# Patient Record
Sex: Female | Born: 1980 | Race: White | Hispanic: No | Marital: Married | Smoking: Never smoker
Health system: Southern US, Community
[De-identification: ages and names within clinical notes are randomized; demographics above are authoritative.]

## PROBLEM LIST (undated history)

## (undated) ENCOUNTER — Inpatient Hospital Stay: Payer: Self-pay

## (undated) DIAGNOSIS — R51 Headache: Secondary | ICD-10-CM

## (undated) DIAGNOSIS — I10 Essential (primary) hypertension: Secondary | ICD-10-CM

## (undated) DIAGNOSIS — F419 Anxiety disorder, unspecified: Secondary | ICD-10-CM

## (undated) DIAGNOSIS — O24419 Gestational diabetes mellitus in pregnancy, unspecified control: Secondary | ICD-10-CM

## (undated) DIAGNOSIS — R519 Headache, unspecified: Secondary | ICD-10-CM

## (undated) DIAGNOSIS — J45909 Unspecified asthma, uncomplicated: Secondary | ICD-10-CM

## (undated) HISTORY — PX: NO PAST SURGERIES: SHX2092

## (undated) HISTORY — PX: WISDOM TOOTH EXTRACTION: SHX21

---

## 2007-04-17 ENCOUNTER — Ambulatory Visit: Payer: Self-pay | Admitting: Emergency Medicine

## 2007-04-17 ENCOUNTER — Emergency Department: Payer: Self-pay | Admitting: Emergency Medicine

## 2016-02-19 ENCOUNTER — Other Ambulatory Visit: Payer: Self-pay | Admitting: Obstetrics & Gynecology

## 2016-02-19 DIAGNOSIS — Z3689 Encounter for other specified antenatal screening: Secondary | ICD-10-CM

## 2016-02-23 ENCOUNTER — Ambulatory Visit
Admission: RE | Admit: 2016-02-23 | Discharge: 2016-02-23 | Disposition: A | Discharge status: Home / Self Care | Payer: Medicaid Other | Source: Ambulatory Visit | Attending: Obstetrics and Gynecology | Admitting: Obstetrics and Gynecology

## 2016-02-23 ENCOUNTER — Ambulatory Visit: Payer: Medicaid Other

## 2016-02-23 DIAGNOSIS — Z3A29 29 weeks gestation of pregnancy: Secondary | ICD-10-CM | POA: Diagnosis not present

## 2016-02-23 DIAGNOSIS — N1330 Unspecified hydronephrosis: Secondary | ICD-10-CM | POA: Diagnosis not present

## 2016-02-23 DIAGNOSIS — O321XX0 Maternal care for breech presentation, not applicable or unspecified: Secondary | ICD-10-CM | POA: Diagnosis not present

## 2016-02-23 DIAGNOSIS — O3509X0 Maternal care for (suspected) other central nervous system malformation or damage in fetus, not applicable or unspecified: Secondary | ICD-10-CM

## 2016-02-23 DIAGNOSIS — O99213 Obesity complicating pregnancy, third trimester: Secondary | ICD-10-CM | POA: Diagnosis not present

## 2016-02-23 DIAGNOSIS — O350XX0 Maternal care for (suspected) central nervous system malformation in fetus, not applicable or unspecified: Secondary | ICD-10-CM | POA: Diagnosis not present

## 2016-02-23 DIAGNOSIS — Z3689 Encounter for other specified antenatal screening: Secondary | ICD-10-CM

## 2016-03-18 DIAGNOSIS — O10919 Unspecified pre-existing hypertension complicating pregnancy, unspecified trimester: Secondary | ICD-10-CM | POA: Insufficient documentation

## 2016-03-22 ENCOUNTER — Ambulatory Visit (HOSPITAL_BASED_OUTPATIENT_CLINIC_OR_DEPARTMENT_OTHER)
Admission: RE | Admit: 2016-03-22 | Discharge: 2016-03-22 | Disposition: A | Discharge status: Home / Self Care | Payer: Medicaid Other | Source: Ambulatory Visit | Attending: Maternal and Fetal Medicine | Admitting: Maternal and Fetal Medicine

## 2016-03-22 ENCOUNTER — Encounter: Payer: Self-pay | Admitting: *Deleted

## 2016-03-22 ENCOUNTER — Ambulatory Visit
Admission: RE | Admit: 2016-03-22 | Discharge: 2016-03-22 | Disposition: A | Discharge status: Home / Self Care | Payer: Medicaid Other | Source: Ambulatory Visit | Attending: Maternal & Fetal Medicine | Admitting: Maternal & Fetal Medicine

## 2016-03-22 ENCOUNTER — Ambulatory Visit: Payer: Medicaid Other | Admitting: Dietician

## 2016-03-22 ENCOUNTER — Observation Stay
Admission: EM | Admit: 2016-03-22 | Discharge: 2016-03-22 | Disposition: A | Discharge status: Home / Self Care | Payer: Medicaid Other | Attending: Obstetrics and Gynecology | Admitting: Obstetrics and Gynecology

## 2016-03-22 VITALS — BP 149/100 | HR 109 | Temp 97.9°F | Resp 20 | Ht 66.0 in | Wt 216.0 lb

## 2016-03-22 VITALS — BP 152/98

## 2016-03-22 DIAGNOSIS — O10913 Unspecified pre-existing hypertension complicating pregnancy, third trimester: Secondary | ICD-10-CM | POA: Diagnosis not present

## 2016-03-22 DIAGNOSIS — O24414 Gestational diabetes mellitus in pregnancy, insulin controlled: Secondary | ICD-10-CM

## 2016-03-22 DIAGNOSIS — O3509X0 Maternal care for (suspected) other central nervous system malformation or damage in fetus, not applicable or unspecified: Secondary | ICD-10-CM

## 2016-03-22 DIAGNOSIS — O10919 Unspecified pre-existing hypertension complicating pregnancy, unspecified trimester: Secondary | ICD-10-CM | POA: Insufficient documentation

## 2016-03-22 DIAGNOSIS — O24410 Gestational diabetes mellitus in pregnancy, diet controlled: Secondary | ICD-10-CM | POA: Diagnosis not present

## 2016-03-22 DIAGNOSIS — Q048 Other specified congenital malformations of brain: Secondary | ICD-10-CM

## 2016-03-22 DIAGNOSIS — O133 Gestational [pregnancy-induced] hypertension without significant proteinuria, third trimester: Secondary | ICD-10-CM | POA: Insufficient documentation

## 2016-03-22 DIAGNOSIS — Z3A33 33 weeks gestation of pregnancy: Secondary | ICD-10-CM | POA: Diagnosis not present

## 2016-03-22 DIAGNOSIS — O24419 Gestational diabetes mellitus in pregnancy, unspecified control: Secondary | ICD-10-CM | POA: Diagnosis not present

## 2016-03-22 DIAGNOSIS — O163 Unspecified maternal hypertension, third trimester: Secondary | ICD-10-CM

## 2016-03-22 DIAGNOSIS — O359XX0 Maternal care for (suspected) fetal abnormality and damage, unspecified, not applicable or unspecified: Secondary | ICD-10-CM

## 2016-03-22 DIAGNOSIS — O321XX0 Maternal care for breech presentation, not applicable or unspecified: Secondary | ICD-10-CM | POA: Insufficient documentation

## 2016-03-22 DIAGNOSIS — O350XX0 Maternal care for (suspected) central nervous system malformation in fetus, not applicable or unspecified: Secondary | ICD-10-CM

## 2016-03-22 DIAGNOSIS — G9389 Other specified disorders of brain: Secondary | ICD-10-CM

## 2016-03-22 DIAGNOSIS — O169 Unspecified maternal hypertension, unspecified trimester: Secondary | ICD-10-CM | POA: Diagnosis present

## 2016-03-22 HISTORY — DX: Headache, unspecified: R51.9

## 2016-03-22 HISTORY — DX: Headache: R51

## 2016-03-22 LAB — PROTEIN / CREATININE RATIO, URINE
Creatinine, Urine: 142 mg/dL
Creatinine, Urine: 137 mg/dL
PROTEIN CREATININE RATIO: 0.21 mg/mg{creat} — AB (ref 0.00–0.15)
Protein Creatinine Ratio: 0.21 mg/mg{Cre} — ABNORMAL HIGH (ref 0.00–0.15)
TOTAL PROTEIN, URINE: 30 mg/dL
Total Protein, Urine: 29 mg/dL

## 2016-03-22 LAB — COMPREHENSIVE METABOLIC PANEL
ALK PHOS: 86 U/L (ref 38–126)
ALT: 10 U/L — AB (ref 14–54)
AST: 16 U/L (ref 15–41)
Albumin: 3.2 g/dL — ABNORMAL LOW (ref 3.5–5.0)
Anion gap: 8 (ref 5–15)
BUN: 8 mg/dL (ref 6–20)
CALCIUM: 8.9 mg/dL (ref 8.9–10.3)
CO2: 22 mmol/L (ref 22–32)
CREATININE: 0.34 mg/dL — AB (ref 0.44–1.00)
Chloride: 105 mmol/L (ref 101–111)
GFR calc non Af Amer: >60 mL/min (ref >60)
Glucose, Bld: 109 mg/dL — ABNORMAL HIGH (ref 65–99)
Potassium: 4.2 mmol/L (ref 3.5–5.1)
SODIUM: 135 mmol/L (ref 135–145)
Total Bilirubin: 0.2 mg/dL — ABNORMAL LOW (ref 0.3–1.2)
Total Protein: 7.2 g/dL (ref 6.5–8.1)

## 2016-03-22 LAB — CBC
HCT: 39.8 % (ref 35.0–47.0)
Hemoglobin: 13.8 g/dL (ref 12.0–16.0)
MCH: 29.7 pg (ref 26.0–34.0)
MCHC: 34.6 g/dL (ref 32.0–36.0)
MCV: 86 fL (ref 80.0–100.0)
PLATELETS: 202 10*3/uL (ref 150–440)
RBC: 4.63 MIL/uL (ref 3.80–5.20)
RDW: 13.7 % (ref 11.5–14.5)
WBC: 14.5 10*3/uL — ABNORMAL HIGH (ref 3.6–11.0)

## 2016-03-22 LAB — URIC ACID: URIC ACID, SERUM: 3.3 mg/dL (ref 2.3–6.6)

## 2016-03-22 MED ORDER — LABETALOL HCL 5 MG/ML IV SOLN: 20.0000 mg | INTRAVENOUS | Status: DC | PRN

## 2016-03-22 MED ORDER — HYDRALAZINE HCL 20 MG/ML IJ SOLN: 10.0000 mg | Freq: Once | INTRAMUSCULAR | Status: DC | PRN

## 2016-03-22 NOTE — Addendum Note (Signed)
Encounter addended by: Lady DeutscherAndra Yarimar Lavis, MD on: 03/22/2016 11:32 AM<BR>    Actions taken: Diagnosis association updated, Order Entry activity accessed

## 2016-03-22 NOTE — Progress Notes (Signed)
[facility] Consultation   Chief Complaint: Gestational diabetes  HPI: Ms. Costella HatcherDana Yonts is a 35 y.o. G1P0 at 10682w0d by based on LMP consistent with ultrasound performed at  [facility] on 12/26/2015 (measurements at that time were reported as 19 weeks 5 days) who presents in consultation from Dr. Leeroy Bockhelsea Ward for consultation regarding the patient's gestational diabetes.  The patient has not had a 3 hr GTT yet, but her glucose screen 5 weeks ago on 02/18/2016 was 215.  The pregnancy is also complicated by advanced maternal age, obesity, chronic hypertension, fetal ventriculomegaly and fetal pyelectasis.  She started her care with [facility], but has transferred her care to [facility].  BPs this pregnancy have ranged from 130s-160s/80s-90s.    Obstetric History:  Obstetric History   G1   P0   T0   P0   A0   L0    SAB0   TAB0   Ectopic0   Multiple0   Live Births0     # Outcome Date GA Lbr Len/2nd Weight Sex Delivery Anes PTL Lv  1 Current               Gynecologic History:  Patient's last menstrual period was 08/03/2015.  Pap negative 12/23/2015  Past Medical History: Obesity, CHTN, asthma as a child  Past Surgical History: She  has a past surgical history that includes No past surgeries.   Medications: Albuterol prn, has not used this pregnancy.  Taking Juice Plus instead of prenatal vitamins as she can tolerate it.    Allergies: Patient is allergic to sulfa antibiotics.   Social History: Patient  reports that she has never smoked. She has never used smokeless tobacco. She reports that she does not drink alcohol or use drugs.   Family History: family history includes Diabetes in her father; Heart attack in her paternal grandmother; Hypertension in her mother; Neurologic Disorder in her mother.   Review of Systems Except for a mild headache, which she says is not uncommon, a full 12 point review of systems was negative or as noted in the History of Present  Illness.  Physical Exam: Weight = 216 lbs  Ht = 5'6"  BMI = 34.9 BP = 149/100.  Repeat 152/98  US today - BREECH, EFW 2025 g, 35%tile, AFI = 18.8 cm, mild cerebral ventriculomegaly is stable with lateral ventricles measuring 10-11 mm.  Renal pelviectasis is no longer present.    02/18/2016 - Cell free fetal DNA - negative for aneuploidy, XY; glucose screen 215 02/24/2016 - AFP uninterpretable due to advanced gestational age 74/17/2017 - P/C ratio 299; normal glucose 118; CMP normal; uric acid 3.0;  CBC with Hb 13.7, Hct 40.3, platelets 234, and WBC 13.6  Asessement/Recommendations: 35 yo G1P0 at 9082w0d with: 1. Gestational diabetes based on glucose screen > 200 -- Cancel 3 hr GTT -- Diabetes diet -- Blood sugar monitoring with FBSs and 2 hr PPs -- The patient has an appointment at [facility] today, if she is released from [facility] in time. -- Insulin prn -- Fetal surveillance as below 2. CHTN with possibility of superimposed preeclampsia - not on medication -- To [facility] now for evaluation -- P/C ratio was sent from here -- If diagnosis of superimposed preeclampsia is confirmed and/or BPs remain elevated in this range, I would recommend delivery at 37 weeks (sooner with evidence of severe preeclampsia) with twice weekly NSTs, weekly AFIs and growth scans every 4 weeks in the interim.  The patient was scheduled for her  next NST here.  Ultimately, if she remains undelivered, she can be [facility] for AFI and NST and weekly at [facility] for [facility].   3. Mild cerebral ventriculomegaly -- Ultrasound in 4 weeks.  If mild ventriculomegaly persists, postnatal ultrasound as well.    Total time spent with the patient was 40 minutes with greater than 50% spent in counseling and coordination of care.  We appreciate this interesting consult and will be happy to be involved in the ongoing care of Ms. Lawerance CruelOgle in anyway her obstetricians desire.  Argentina PonderAndra H. Emilo Gras, MD [facility]

## 2016-03-22 NOTE — Discharge Summary (Signed)
  Patient ID: Meghan HatcherDana Lowe, female   DOB: 1980/11/24, 35 y.o.   MRN: [id number] Meghan HatcherDana Lowe 1980/11/24 G1 P0 4633w3d presents for  eval being sent from MFM office today with elevated BP . Being seen there for a h/o of ventriculomegaly . BPP today 8/8.  Nothing to eat this am .  Feels some lower back pain . Breech on u/s today . Elevated 1 hr GTT and will be seeing [facility] for diabetic teaching . noLOF , no vaginal bleeding , O;BP (!) 173/108 (BP Location: Left Arm)   Pulse (!) 117   Temp 98.4 F (36.9 C) (Oral)   Resp 18   LMP 08/03/2015  ABDsoft NT  CX closed / 60 /-3 NST120-130 + accels . CTX q 2-5 min Labs: PIH labs: all WNL A: transient gestational HTN  Pd/c to bedrest  Cont NST / AFI with MFM on Mondays and schedule NST with us on Thursday  [facility] appt for GDM

## 2016-03-22 NOTE — OB Triage Note (Signed)
Pt. [facility] for NST and PIH workup due to high BP in clinic.   FHR - 130s, with accels and no decels, mod variability, Cat. 1 tracing, Per pt. "not feeling  Contractions", abd. Soft between contractions.

## 2016-03-22 NOTE — Discharge Instructions (Signed)
Pt. Has scheduled appointment with Maternal Fetal Medicine on Thursday, August 24th; also to come to the unit for NST here on [facility]; please call office to see if you need to be seen before your scheduled appt. On August 31st.  Pt. Re-scheduled for Nutrition appt. On tomorrow at 0900 and can also stop by office today after discharge to see if she can be seen.  Pt. Verbalized understanding, signed copies, one copy given.

## 2016-03-23 ENCOUNTER — Encounter: Payer: Medicaid Other | Attending: Maternal & Fetal Medicine | Admitting: Dietician

## 2016-03-23 VITALS — BP 144/96 | Ht 66.0 in | Wt 215.0 lb

## 2016-03-23 DIAGNOSIS — Z713 Dietary counseling and surveillance: Secondary | ICD-10-CM | POA: Insufficient documentation

## 2016-03-23 DIAGNOSIS — Z6834 Body mass index (BMI) 34.0-34.9, adult: Secondary | ICD-10-CM | POA: Diagnosis not present

## 2016-03-23 DIAGNOSIS — O24419 Gestational diabetes mellitus in pregnancy, unspecified control: Secondary | ICD-10-CM | POA: Diagnosis not present

## 2016-03-23 DIAGNOSIS — O24410 Gestational diabetes mellitus in pregnancy, diet controlled: Secondary | ICD-10-CM

## 2016-03-23 NOTE — Progress Notes (Signed)
Diabetes Self-Management Education  Visit Type: First/Initial  Appt. Start Time: 0900 Appt. End Time: 1030  03/23/2016  [name], identified by name and date of birth, is a 35 y.o. female with a diagnosis of Diabetes: Gestational Diabetes.   ASSESSMENT  Blood pressure (!) 144/96, height 5\' 6"  (1.676 m), weight 215 lb (97.5 kg), last menstrual period 08/03/2015. Body mass index is 34.7 kg/m. Lacks knowledge of diabetes care  Is not testing BG's-does not have a BG meter     Diabetes Self-Management Education - 03/23/16 1054      Visit Information   Visit Type First/Initial     Initial Visit   Diabetes Type Gestational Diabetes     Health Coping   How would you rate your overall health? Good     Psychosocial Assessment   Patient Belief/Attitude about Diabetes Motivated to manage diabetes   Self-care barriers None   Self-management support Doctor's office   Other persons present Spouse/SO   Patient Concerns Glycemic Control   Special Needs None   Preferred Learning Style Visual   Learning Readiness Ready   What is the last grade level you completed in school? 12     Pre-Education Assessment   Patient understands the diabetes disease and treatment process. Needs Instruction   Patient understands incorporating nutritional management into lifestyle. Needs Instruction   Patient undertands incorporating physical activity into lifestyle. Needs Instruction   Patient understands using medications safely. Needs Instruction   Patient understands monitoring blood glucose, interpreting and using results Needs Instruction   Patient understands prevention, detection, and treatment of acute complications. Needs Instruction   Patient understands prevention, detection, and treatment of chronic complications. Needs Instruction   Patient understands how to develop strategies to address psychosocial issues. Needs Instruction   Patient understands how to develop strategies to promote  health/change behavior. Needs Instruction     Complications   How often do you check your blood sugar? 0 times/day (not testing)   Have you had a dilated eye exam in the past 12 months? No  16 years ago   Have you had a dental exam in the past 12 months? No  16 years ago   Are you checking your feet? No     Dietary Intake   Breakfast --  eats breakfast 8-8:30a (today ate regular yogurt)   Lunch --  eats lunch at 12-eats light meal(occasionally eats Malaw[place]urkey sandwich)      Snack (afternoon) --  eats snack 3-4p=celery with pnut butter or crackers with cheese   Dinner --  eats supper 6-7p-eats small amounts and more protein-limits carbohydrate intake   Snack (evening) --  none   Beverage(s) --  drinks water 6-7x/day     Exercise   Exercise Type ADL's  [facility]16 for high BP and having contractions-MD advised "to take it easy"     Patient Education   Previous Diabetes Education No   Disease state  --  discussed GDM and importance of good BG control to lower risk of complications with pregnancy along with treatment options   Nutrition management  Role of diet in the treatment of diabetes and the relationship between the three main macronutrients and blood glucose level;Food label reading, portion sizes and measuring food.;Carbohydrate counting   Physical activity and exercise  Role of exercise on diabetes management, blood pressure control and cardiac health.  role of exercise to promote BG control-advised pt to ask MD at next visit for permission to walk  daily-pt reports no contractions since last night   Medications --  discussed medications used with GDM    Monitoring Taught/evaluated SMBG meter.;Purpose and frequency of SMBG.;Taught/discussed recording of test results and interpretation of SMBG.;Identified appropriate SMBG and/or A1C goals.  gave pt Accuchek Guide meter and instructed on its use-BG 136 (2 hr pp-ate regular yogurt for breakfast)   Acute  complications Discussed and identified patients' treatment of hyperglycemia.   Psychosocial adjustment Role of stress on diabetes;Identified and addressed patients feelings and concerns about diabetes   Preconception care Pregnancy and GDM  Role of pre-pregnancy blood glucose control on the development of the fetus;Reviewed with patient blood glucose goals with pregnancy;Role of family planning for patients with diabetes   Personal strategies to promote health Lifestyle issues that need to be addressed for better diabetes care;Helped patient develop diabetes management plan for (enter comment)     Outcomes   Expected Outcomes Demonstrated interest in learning. Expect positive outcomes      Individualized Plan for Diabetes Self-Management Training:   Learning Objective:  Patient will have a greater understanding of diabetes self-management. Patient education plan is to attend individual and/or group sessions per assessed needs and concerns.   Plan:   Patient Instructions  Read booklet on Gestational Diabetes Follow Gestational Meal Planning Guidelines Complete a 3 Day Food Record and bring to next appointment Check blood sugars 4 x day - before breakfast and 2 hrs after every meal and record  Bring blood sugar log to all appointments Call MD for prescription for meter strips and lancets Strips   Accuchek Guide test strips  Lancets   Accuchek Fastclix lancets Purchase urine ketone strips Check urine ketones every am:  If + increase bedtime snack to 1 protein and 2 carbohydrate servings Walk 20-30 minutes at least 5 x week if permitted by MD-ask MD at next appointment (03-25-16)for permission to walk 20-30 min. daily Next appointment    04-01-16   Expected Outcomes:  Demonstrated interest in learning. Expect positive outcomes  Education material provided: Meal Guidelines for Healthy Pregnancy, Accuchek Guide meter  If problems or questions, patient to contact team via:   [phone]  Future DSME appointment:  04-01-16

## 2016-03-23 NOTE — Patient Instructions (Signed)
Read booklet on Gestational Diabetes Follow Gestational Meal Planning Guidelines Complete a 3 Day Food Record and bring to next appointment Check blood sugars 4 x day - before breakfast and 2 hrs after every meal and record  Bring blood sugar log to all appointments Call MD for prescription for meter strips and lancets Strips   Accuchek Guide test strips  Lancets   Accuchek Fastclix lancets Purchase urine ketone strips Check urine ketones every am:  If + increase bedtime snack to 1 protein and 2 carbohydrate servings Walk 20-30 minutes at least 5 x week if permitted by MD-ask MD at next appointment (03-25-16)for permission to walk 20-30 min. daily Next appointment    04-01-16

## 2016-03-25 ENCOUNTER — Ambulatory Visit
Admission: RE | Admit: 2016-03-25 | Discharge: 2016-03-25 | Disposition: A | Discharge status: Home / Self Care | Payer: Medicaid Other | Source: Ambulatory Visit | Attending: Obstetrics & Gynecology | Admitting: Obstetrics & Gynecology

## 2016-03-25 ENCOUNTER — Inpatient Hospital Stay
Admission: EM | Admit: 2016-03-25 | Discharge: 2016-03-25 | Disposition: A | Discharge status: Home / Self Care | Payer: Medicaid Other | Attending: Obstetrics and Gynecology | Admitting: Obstetrics and Gynecology

## 2016-03-25 ENCOUNTER — Encounter: Payer: Self-pay | Admitting: *Deleted

## 2016-03-25 DIAGNOSIS — O24419 Gestational diabetes mellitus in pregnancy, unspecified control: Secondary | ICD-10-CM | POA: Insufficient documentation

## 2016-03-25 DIAGNOSIS — O10913 Unspecified pre-existing hypertension complicating pregnancy, third trimester: Secondary | ICD-10-CM

## 2016-03-25 DIAGNOSIS — Q048 Other specified congenital malformations of brain: Secondary | ICD-10-CM | POA: Diagnosis not present

## 2016-03-25 DIAGNOSIS — O350XX0 Maternal care for (suspected) central nervous system malformation in fetus, not applicable or unspecified: Secondary | ICD-10-CM

## 2016-03-25 DIAGNOSIS — O99213 Obesity complicating pregnancy, third trimester: Secondary | ICD-10-CM | POA: Insufficient documentation

## 2016-03-25 DIAGNOSIS — O3509X0 Maternal care for (suspected) other central nervous system malformation or damage in fetus, not applicable or unspecified: Secondary | ICD-10-CM

## 2016-03-25 DIAGNOSIS — O24414 Gestational diabetes mellitus in pregnancy, insulin controlled: Secondary | ICD-10-CM

## 2016-03-25 DIAGNOSIS — O163 Unspecified maternal hypertension, third trimester: Secondary | ICD-10-CM | POA: Diagnosis present

## 2016-03-25 DIAGNOSIS — Z3A33 33 weeks gestation of pregnancy: Secondary | ICD-10-CM | POA: Diagnosis not present

## 2016-03-25 DIAGNOSIS — O09513 Supervision of elderly primigravida, third trimester: Secondary | ICD-10-CM | POA: Insufficient documentation

## 2016-03-25 HISTORY — DX: Anxiety disorder, unspecified: F41.9

## 2016-03-25 HISTORY — DX: Gestational diabetes mellitus in pregnancy, unspecified control: O24.419

## 2016-03-25 HISTORY — DX: Essential (primary) hypertension: I10

## 2016-03-25 LAB — PROTEIN / CREATININE RATIO, URINE
Creatinine, Urine: 179 mg/dL
PROTEIN CREATININE RATIO: 0.18 mg/mg{creat} — AB (ref 0.00–0.15)
Total Protein, Urine: 32 mg/dL

## 2016-03-25 LAB — CBC WITH DIFFERENTIAL/PLATELET
BASOS PCT: 1 %
Basophils Absolute: 0.1 10*3/uL (ref 0–0.1)
EOS ABS: 0.1 10*3/uL (ref 0–0.7)
EOS PCT: 1 %
HCT: 40 % (ref 35.0–47.0)
HEMOGLOBIN: 13.8 g/dL (ref 12.0–16.0)
LYMPHS ABS: 2 10*3/uL (ref 1.0–3.6)
Lymphocytes Relative: 17 %
MCH: 29.6 pg (ref 26.0–34.0)
MCHC: 34.7 g/dL (ref 32.0–36.0)
MCV: 85.4 fL (ref 80.0–100.0)
MONO ABS: 1 10*3/uL — AB (ref 0.2–0.9)
MONOS PCT: 8 %
NEUTROS PCT: 73 %
Neutro Abs: 8.9 10*3/uL — ABNORMAL HIGH (ref 1.4–6.5)
Platelets: 211 10*3/uL (ref 150–440)
RBC: 4.68 MIL/uL (ref 3.80–5.20)
RDW: 13.4 % (ref 11.5–14.5)
WBC: 12.1 10*3/uL — ABNORMAL HIGH (ref 3.6–11.0)

## 2016-03-25 LAB — COMPREHENSIVE METABOLIC PANEL
ALBUMIN: 3.4 g/dL — AB (ref 3.5–5.0)
ALK PHOS: 85 U/L (ref 38–126)
ALT: 10 U/L — AB (ref 14–54)
AST: 16 U/L (ref 15–41)
Anion gap: 7 (ref 5–15)
BUN: 9 mg/dL (ref 6–20)
CALCIUM: 9.1 mg/dL (ref 8.9–10.3)
CHLORIDE: 104 mmol/L (ref 101–111)
CO2: 24 mmol/L (ref 22–32)
CREATININE: 0.4 mg/dL — AB (ref 0.44–1.00)
GFR calc non Af Amer: >60 mL/min (ref >60)
GLUCOSE: 108 mg/dL — AB (ref 65–99)
Potassium: 4.3 mmol/L (ref 3.5–5.1)
SODIUM: 135 mmol/L (ref 135–145)
Total Bilirubin: 0.4 mg/dL (ref 0.3–1.2)
Total Protein: 7.1 g/dL (ref 6.5–8.1)

## 2016-03-25 MED ORDER — NIFEDIPINE 10 MG PO CAPS
ORAL_CAPSULE | ORAL | Status: AC
  Filled 2016-03-25: qty 3

## 2016-03-25 MED ORDER — NIFEDIPINE ER 30 MG PO TB24
30.0000 mg | ORAL_TABLET | Freq: Once | ORAL | Status: AC
  Administered 2016-03-25: 30 mg via ORAL
  Filled 2016-03-25: qty 1

## 2016-03-25 MED ORDER — ASPIRIN 81 MG PO CHEW: 81.0000 mg | CHEWABLE_TABLET | Freq: Every day | ORAL | 3 refills | Status: DC

## 2016-03-25 MED ORDER — NIFEDIPINE ER OSMOTIC RELEASE 30 MG PO TB24: 30.0000 mg | ORAL_TABLET | Freq: Every day | ORAL | 2 refills | Status: DC

## 2016-03-25 NOTE — Discharge Summary (Signed)
Discharge instructions reviewed with patient including follow up appointments, preterm labor precautions, signs of hypertension in pregnancy, and when to seek medical attention. All questions answered. Patient discharged home, ambulatory with steady gait, escorted by significant other. No signs of distress observed at time of discharge.

## 2016-03-25 NOTE — Discharge Summary (Signed)
Obstetric Discharge Summary   Patient ID: Meghan Lowe MRN: [id number] DOB/AGE: Dec 26, 1980 34 y.o.   Date of Admission: 03/25/2016  Date of Discharge: 03/25/16  Admitting Diagnosis:IUP at 7371w6d w/CHTN, Gest DM  Secondary Diagnosis: AMA, Obesity  Mode of Delivery:Pt has not delivered yet Discharge Diagnosis: IUP at 33 6/7 weeks with CHTN   Intrapartum Procedures: NST, Procarida    Complications:Prot/creat 180 today, pending 24 h urine for protein/creatinine  Brief Hospital Course  Meghan Lowe is a G1P0 who [facility] and had to be seen in [facility] for serial BP's and prot/creat ratio.   Labs: CBC Latest Ref Rng & Units 03/25/2016 03/22/2016  WBC 3.6 - 11.0 K/uL 12.1(H) 14.5(H)  Hemoglobin 12.0 - 16.0 g/dL 08.613.8 57.813.8  Hematocrit 46.935.0 - 47.0 % 40.0 39.8  Platelets 150 - 440 K/uL 211 202   Physical exam:  Blood pressure (!) 148/97, pulse (!) 107, temperature 98.1 F (36.7 C), temperature source Oral, resp. rate 18, last menstrual period 08/03/2015. Discharge Instructions: Per After Visit Summary. Activity: Advance as tolerated.  Diet: Regular  Outpatient follow up: FU office in 1 week for BP check Discharged Condition:Stable  Discharged GE:XBMWto:Home    Sharee PimpleCaron W Jones, CNM 03/25/2016

## 2016-03-25 NOTE — OB Triage Note (Signed)
[facility] Law[facility]gle [facility]

## 2016-03-25 NOTE — H&P (Signed)
35 yo G1 with unsure  LMP of 08/04/15 and irreg cycles, [facility] with dating on US at 20 weeks with EDD of 05/10/16. Pt is transferring due to Dr Elesa MassedWard joining the practice in Sept. Pt is also a CHTN, Gest DM, Obese, AMA and has a fetal ventriculomegaly issue where [facility] is following pt closely with Bi-weekly NST's and AFI weekly. Disc this am w/ [facility]Wheeler at [facility] re: Pre-ecclampsia vs CHTN. Protein/creat ratio: 180 today. 24 h urine for protein/creatinine pending. Should have results in the am. Past Medical History:  Diagnosis Date  . Anxiety   . Gestational diabetes   . Headache    mirgraines started around the age of [age] years old  . Hypertension    Past Surgical History:  Procedure Laterality Date  . NO PAST SURGERIES     Family History  Problem Relation Age of Onset  . Hypertension Mother   . Neurologic Disorder Mother   . Diabetes Father   . Heart attack Paternal Grandmother    Social History   Social History  . Marital status: Married    Spouse name: N/A  . Number of children: N/A  . Years of education: N/A   Occupational History  . Not on file.   Social History Main Topics  . Smoking status: Never Smoker  . Smokeless tobacco: Never Used  . Alcohol use No  . Drug use: No  . Sexual activity: Yes   Other Topics Concern  . Not on file   Social History Narrative  . No narrative on file  BP reviewed and BP 147/97 now. Pt just started today on the Procardia 30 mg XL per Dr Tyler DeisWheeler. Disc case with Dr Feliberto GottronSchermerhorn and he agrees that pt can be discharged and plan to fu in 1 week on the Procardia is planned. Fetal stip reviewed as reactive with 2 accels 15 x 15 BPM. Some UC's were noted. No HA, blurred visioin, RUQ pain or proteinuria today. A; 1. AMA 2. Obesity 3. CHTN (now on Procardia 30 mg XL) qd 4. IUP t 33 4/7 weeks P; DC home 2. FU at [facility] in 1 week for BP check. 3. [facility]KC's. 4. NST 2 x a week 5. AFI weekly 6. Procardia 30 mg XL po  qd. 7. Add Baby ASA 81 mg qd.

## 2016-03-25 NOTE — Progress Notes (Signed)
MFM Note  1. NST reviewed and is reactive.  Baseline 130's/mod var/+accels/no decels.  Irreg ctx.   2. Ms. Lawerance CruelOgle was recently diagnosed with gestational diabetes on 8/21.  Her log thus far only includes two days worth of checks.  Her fastings are 107/109 and her post breakfasts were 136/137.  Her post lunch and dinnner values have been well-controlled thus far.  She may require nighttime insulin vs. An oral hypoglycemic agent to improve her fasting and post breakfast sugars.  Given the limited amount of data nothing was started today.   3. Her BP today was elevated to  159/95.  Ms. Lawerance CruelOgle had elevated bps at her 8/21 MFM consult.  I suspect Ms. Lawerance CruelOgle has undiagnosed cHTN, however we cannot rule out gestational hypertension vs. Superimposed preeclampsia.  Ms. Chauncy LeanOgle's first prenatal visit was at [redacted] weeks gestation.  At that time her bp was 152/96.  She reports she have never been diagnosed with cHTN but has not had her bp checked outside of pregnancy.  During her 8/21 triage visit she had normal HELLP labs.  She completed a 24 hour urine that is pending.  Her PCR on 8/21 was 210.   Today,  Ms. Lawerance CruelOgle has no symptoms of PEC - she denies HA, vision changes, RUQ pain, N/V and malaise.  On exam she had no LE edema and normal reflexes.    Given her elevated bps and risk factors of PEC (AMA, GDM) I recommended repeat L&D evaluation with repeat labs.  If no e/o PEC it would be reasonable to consider starting a low dose bp control (Nifedipine 30 XL daily) for bp control.    I would recommend continued twice weekly NSTs, weekly AFI and weekly HELLP labs to ensure no evidence of evolving PEC.  Her last growth sono was on 8/21 and WNL at 35%.  We recommend repeat growth in 4 weeks to evaluate growth and mild ventriculomegaly that was noted on the 8/21 sono.  Our findings and this plan were discussed with [facility] CNM.  We are happy to provide any further testing, ultrasounds and management recommendations as  desired.  Janeann ForehandS. Joycelyn Liska, MD

## 2016-03-29 ENCOUNTER — Ambulatory Visit
Admission: RE | Admit: 2016-03-29 | Discharge: 2016-03-29 | Disposition: A | Discharge status: Home / Self Care | Payer: Medicaid Other | Source: Ambulatory Visit | Attending: Obstetrics & Gynecology | Admitting: Obstetrics & Gynecology

## 2016-03-29 ENCOUNTER — Other Ambulatory Visit: Payer: Self-pay

## 2016-03-29 ENCOUNTER — Ambulatory Visit (HOSPITAL_BASED_OUTPATIENT_CLINIC_OR_DEPARTMENT_OTHER)
Admission: RE | Admit: 2016-03-29 | Discharge: 2016-03-29 | Disposition: A | Discharge status: Home / Self Care | Payer: Medicaid Other | Source: Ambulatory Visit | Attending: Obstetrics & Gynecology | Admitting: Obstetrics & Gynecology

## 2016-03-29 DIAGNOSIS — Z3A34 34 weeks gestation of pregnancy: Secondary | ICD-10-CM | POA: Insufficient documentation

## 2016-03-29 DIAGNOSIS — O350XX0 Maternal care for (suspected) central nervous system malformation in fetus, not applicable or unspecified: Secondary | ICD-10-CM | POA: Diagnosis not present

## 2016-03-29 DIAGNOSIS — O24414 Gestational diabetes mellitus in pregnancy, insulin controlled: Secondary | ICD-10-CM | POA: Diagnosis not present

## 2016-03-29 DIAGNOSIS — O3509X0 Maternal care for (suspected) other central nervous system malformation or damage in fetus, not applicable or unspecified: Secondary | ICD-10-CM

## 2016-03-29 DIAGNOSIS — O24419 Gestational diabetes mellitus in pregnancy, unspecified control: Secondary | ICD-10-CM | POA: Diagnosis not present

## 2016-03-29 DIAGNOSIS — O10913 Unspecified pre-existing hypertension complicating pregnancy, third trimester: Secondary | ICD-10-CM

## 2016-03-29 DIAGNOSIS — O163 Unspecified maternal hypertension, third trimester: Secondary | ICD-10-CM

## 2016-03-29 DIAGNOSIS — Q048 Other specified congenital malformations of brain: Secondary | ICD-10-CM

## 2016-03-29 NOTE — Progress Notes (Signed)
Maternal-Fetal Medicine Followup Visit: See MFM consult from Dr. Fayrene FearingJames on 03/22/16 and f/u visit by Dr. Tyler DeisWheeler on 03/25/16.  Meghan HarmanDana feels well. She has been started on Procardia XL 30 mg and is checking her blood sugars.  She is identifying foods that increase her sugars.  She has no complaints.  Exam: Vitals:   03/29/16 0815  BP: (!) 148/90  Pulse: (!) 115   Abd: soft, nontender  Accuchecks: Fasting: 107, 109, 107, 105, 105, 93 2hr PP breakfast: 136, 137, 130, 146, 110 2hr PP lunch: 114, 113, 110, 158 ([place]), 147 2hr PP dinner: 122, 103, 126, 123, 120, 137  BPP today: 8/8 and normal fluid. See US report  -Continue twice-weekly testing with BP[facility] on Mondays at [facility] and NSTs on Thursdays at [facility] on Mondays. -Pt states she is scheduled for delivery in her 37th week -Growth scan in three weeks to f/u mild ventriculomegaly and assess growth. -Discussed starting low-dose glyburide in evenings to help with fastings but is reasonable to see if she can make further adjustments in her diet. She has a [facility] appt on Thursday and [facility] appt on Thursday. She can be started at that time -We contacted [facility] to see if they would like us to follow her GDM.  A f/u consult was not scheduled but we will be happy to schedule if desired.  Bertel Venard, [place] A, MD

## 2016-04-01 ENCOUNTER — Inpatient Hospital Stay
Admission: EM | Admit: 2016-04-01 | Discharge: 2016-04-01 | Disposition: A | Discharge status: Home / Self Care | Payer: Medicaid Other | Attending: Obstetrics and Gynecology | Admitting: Obstetrics and Gynecology

## 2016-04-01 ENCOUNTER — Encounter: Payer: Self-pay | Admitting: *Deleted

## 2016-04-01 ENCOUNTER — Ambulatory Visit: Payer: Medicaid Other | Admitting: Dietician

## 2016-04-01 DIAGNOSIS — O99213 Obesity complicating pregnancy, third trimester: Secondary | ICD-10-CM | POA: Insufficient documentation

## 2016-04-01 DIAGNOSIS — O24419 Gestational diabetes mellitus in pregnancy, unspecified control: Secondary | ICD-10-CM | POA: Diagnosis not present

## 2016-04-01 DIAGNOSIS — O3509X0 Maternal care for (suspected) other central nervous system malformation or damage in fetus, not applicable or unspecified: Secondary | ICD-10-CM

## 2016-04-01 DIAGNOSIS — O99343 Other mental disorders complicating pregnancy, third trimester: Secondary | ICD-10-CM | POA: Diagnosis not present

## 2016-04-01 DIAGNOSIS — O10913 Unspecified pre-existing hypertension complicating pregnancy, third trimester: Secondary | ICD-10-CM

## 2016-04-01 DIAGNOSIS — F419 Anxiety disorder, unspecified: Secondary | ICD-10-CM | POA: Diagnosis not present

## 2016-04-01 DIAGNOSIS — O24414 Gestational diabetes mellitus in pregnancy, insulin controlled: Secondary | ICD-10-CM

## 2016-04-01 DIAGNOSIS — O350XX0 Maternal care for (suspected) central nervous system malformation in fetus, not applicable or unspecified: Secondary | ICD-10-CM

## 2016-04-01 DIAGNOSIS — Z3A34 34 weeks gestation of pregnancy: Secondary | ICD-10-CM | POA: Insufficient documentation

## 2016-04-01 DIAGNOSIS — I10 Essential (primary) hypertension: Secondary | ICD-10-CM | POA: Diagnosis present

## 2016-04-01 NOTE — Discharge Instructions (Signed)
Discharge instructions reviewed with patient, patient verbalized understanding, plan of care reviewed with patient.  Copies signed and given. Pt. Will continue with scheduled OB appointments.

## 2016-04-01 NOTE — Progress Notes (Signed)
35 yo G   At 2934 3/7 weeks with CHTN on Procardia 30 mg XL mg  With BP today of 162/102 and now 149/105. Pt also has A2GDM starting on Glyburide 2.5 mg starting this pm. Pt was already seen at office today. Here for NST which was not scheduled since [facility]. Saw Dr Leilani MerlGrotecut due to fetal ventriculomegaly.  Past Medical History:  Diagnosis Date  . Anxiety   . Gestational diabetes   . Headache    mirgraines started around the age of [age] years old  . Hypertension    Past Surgical History:  Procedure Laterality Date  . NO PAST SURGERIES     Family History  Problem Relation Age of Onset  . Hypertension Mother   . Neurologic Disorder Mother   . Diabetes Father   . Heart attack Paternal Grandmother    Social History   Social History  . Marital status: Married    Spouse name: N/A  . Number of children: N/A  . Years of education: N/A   Occupational History  . Not on file.   Social History Main Topics  . Smoking status: Never Smoker  . Smokeless tobacco: Never Used  . Alcohol use No  . Drug use: No  . Sexual activity: Yes   Other Topics Concern  . Not on file   Social History Narrative  . No narrative on file  Gen: 35 yo white female in NAD.  Blood pressure (!) 142/105, pulse (!) 116, temperature 98.1 F (36.7 C), temperature source Oral, resp. rate 17, last menstrual period 08/03/2015. NST reactive with 2 accels 15 x 15 BPM. A: A2GDM 2. IUP at 34 2/7 weeks 3. CHTN on Procardia 30 mg XL 4. Obesity P: [facility] Elesa MassedWard who has not started here yet 2. Increase Procardia to 60 mg XL qd 3. Need weekly pre-ecclampsia labs (will do on Monday) 4. US growth on 04/19/16 5. Glyburide 2.5 mg to start tonight 6. IOL scheduled with Dr Elesa MassedWard on 04/19/16 with Cervidil planned. 7. [facility] and [facility] q Thurs for NST. BPP to be done on Mondays. 8. Since it is labor day on Monday, report to Dr Feliberto GottronSchermerhorn re: NST and BPP  due on Monday. 9. Pt has no S/S of pre-ecclampsia: Ha, RUQ pain, blurred vision today

## 2016-04-01 NOTE — OB Triage Note (Addendum)
Pt. Sent from office visit for NST.  Positive for fetal movement, denies gush of fluid, denies vaginal bleeding. U/S in place with FHR 115 bpm; abd. Soft.

## 2016-04-02 ENCOUNTER — Encounter: Payer: Self-pay | Admitting: Dietician

## 2016-04-02 NOTE — Progress Notes (Signed)
Patient cancelled her RD appointment for 04/01/16, stating she did not need to return because she is starting diabetes medication.  Sent discharge letter to MD.

## 2016-04-05 ENCOUNTER — Inpatient Hospital Stay
Admission: RE | Admit: 2016-04-05 | Discharge: 2016-04-05 | Disposition: A | Discharge status: Home / Self Care | Payer: Medicaid Other | Attending: Obstetrics and Gynecology | Admitting: Obstetrics and Gynecology

## 2016-04-05 DIAGNOSIS — Z3A35 35 weeks gestation of pregnancy: Secondary | ICD-10-CM | POA: Diagnosis not present

## 2016-04-05 DIAGNOSIS — O09513 Supervision of elderly primigravida, third trimester: Secondary | ICD-10-CM | POA: Insufficient documentation

## 2016-04-05 DIAGNOSIS — O133 Gestational [pregnancy-induced] hypertension without significant proteinuria, third trimester: Secondary | ICD-10-CM | POA: Diagnosis not present

## 2016-04-05 DIAGNOSIS — O24419 Gestational diabetes mellitus in pregnancy, unspecified control: Secondary | ICD-10-CM | POA: Insufficient documentation

## 2016-04-05 DIAGNOSIS — Z7984 Long term (current) use of oral hypoglycemic drugs: Secondary | ICD-10-CM | POA: Insufficient documentation

## 2016-04-05 LAB — COMPREHENSIVE METABOLIC PANEL
ALK PHOS: 111 U/L (ref 38–126)
ALT: 11 U/L — AB (ref 14–54)
AST: 17 U/L (ref 15–41)
Albumin: 3.5 g/dL (ref 3.5–5.0)
Anion gap: 8 (ref 5–15)
BUN: 12 mg/dL (ref 6–20)
CALCIUM: 9.2 mg/dL (ref 8.9–10.3)
CO2: 21 mmol/L — AB (ref 22–32)
CREATININE: 0.43 mg/dL — AB (ref 0.44–1.00)
Chloride: 105 mmol/L (ref 101–111)
Glucose, Bld: 105 mg/dL — ABNORMAL HIGH (ref 65–99)
Potassium: 4 mmol/L (ref 3.5–5.1)
Sodium: 134 mmol/L — ABNORMAL LOW (ref 135–145)
Total Bilirubin: 0.5 mg/dL (ref 0.3–1.2)
Total Protein: 7.5 g/dL (ref 6.5–8.1)

## 2016-04-05 LAB — CBC
HCT: 41.3 % (ref 35.0–47.0)
Hemoglobin: 14.1 g/dL (ref 12.0–16.0)
MCH: 29.4 pg (ref 26.0–34.0)
MCHC: 34.2 g/dL (ref 32.0–36.0)
MCV: 85.9 fL (ref 80.0–100.0)
Platelets: 213 10*3/uL (ref 150–440)
RBC: 4.81 MIL/uL (ref 3.80–5.20)
RDW: 13.5 % (ref 11.5–14.5)
WBC: 13.4 10*3/uL — ABNORMAL HIGH (ref 3.6–11.0)

## 2016-04-05 LAB — PROTEIN / CREATININE RATIO, URINE
Creatinine, Urine: 124 mg/dL
Protein Creatinine Ratio: 0.14 mg/mg{Cre} (ref 0.00–0.15)
Total Protein, Urine: 17 mg/dL

## 2016-04-05 MED ORDER — LABETALOL HCL 100 MG PO TABS
100.0000 mg | ORAL_TABLET | Freq: Once | ORAL | Status: AC
Start: 2016-04-05 — End: 2016-04-05
  Administered 2016-04-05: 100 mg via ORAL
  Filled 2016-04-05: qty 1

## 2016-04-05 MED ORDER — LABETALOL HCL 5 MG/ML IV SOLN: 20.0000 mg | INTRAVENOUS | Status: DC | PRN

## 2016-04-05 MED ORDER — HYDRALAZINE HCL 20 MG/ML IJ SOLN
10.0000 mg | Freq: Once | INTRAMUSCULAR | Status: DC | PRN
Start: 2016-04-05 — End: 2016-04-05

## 2016-04-05 NOTE — Progress Notes (Signed)
Patient ID: Meghan HatcherDana Lowe, female   DOB: 1980/08/29, 35 y.o.   MRN: [id number] Meghan HatcherDana Lowe 1980/08/29 G1 P0 3153w3d presents for BP eval. Gestational HTN and A2 GDm on glyburide .Sugars for lunch and dinner are elevated , but she has been eating fast food  Currently takes procardia xl 60 mg , but has not taken her meds today . No vision change and no H/A noLOF , no vaginal bleeding , O;BP (!) 146/100   Pulse (!) 106   Temp 98.1 F (36.7 C) (Oral)   Resp 17   Ht 5\' 6"  (1.676 m)   Wt 215 lb (97.5 kg)   LMP 08/03/2015   BMI 34.70 kg/m  ABDsoft  nt  CX none NST140 +accels , no decels , REACTIVE  Labs: Protein / cr ratio: 140, LFTs and CBC nl  Added in 100 mg labetalol and DBP came down into the low 90's A: Gestational HTN  , no eveidence of PIH . GDM suboptimal control . Reassuring fetal monitoring  Padd 100 mg labetalol bid with procardia xl e60 mg  Tighten up her eating and reeval sugars on this Thursday . If elevated add bid .  Precaution for vision change or h/sa to rtc

## 2016-04-05 NOTE — Discharge Summary (Signed)
  35 y.o.   MRN: [id number] Meghan HatcherDana Collier [birth year] G1 P0 7226w3d presents for BP eval. Gestational HTN and A2 GDm on glyburide .Sugars for lunch and dinner are elevated , but she has been eating fast food  Currently takes procardia xl 60 mg , but has not taken her meds today . No vision change and no H/A noLOF , no vaginal bleeding , O;BP (!) 146/100   Pulse (!) 106   Temp 98.1 F (36.7 C) (Oral)   Resp 17   Ht 5\' 6"  (1.676 m)   Wt 215 lb (97.5 kg)   LMP 08/03/2015   BMI 34.70 kg/m  ABDsoft  nt  CX none NST140 +accels , no decels , REACTIVE  Labs: Protein / cr ratio: 140, LFTs and CBC nl  Added in 100 mg labetalol and DBP came down into the low 90's A: Gestational HTN  , no eveidence of PIH . GDM suboptimal control . Reassuring fetal monitoring  Padd 100 mg labetalol bid with procardia xl e60 mg  Tighten up her eating and reeval sugars on this Thursday . If elevated add bid .  Precaution for vision change or h/sa to rtc        Admission (Current) on 04/05/2016        Detailed Report

## 2016-04-05 NOTE — Discharge Summary (Signed)
Discharge instructions reviewed with patient including follow up appointments, prescription for labetalol, signs of preeclampsia, and when to seek medical attention. All questions answered. Patient discharged home, ambulatory with steady gait, escorted by family member. No signs of distress observed at time of discharge.

## 2016-04-12 ENCOUNTER — Ambulatory Visit
Admission: RE | Admit: 2016-04-12 | Discharge: 2016-04-12 | Disposition: A | Discharge status: Home / Self Care | Payer: Medicaid Other | Source: Ambulatory Visit | Attending: Obstetrics and Gynecology | Admitting: Obstetrics and Gynecology

## 2016-04-12 DIAGNOSIS — O24419 Gestational diabetes mellitus in pregnancy, unspecified control: Secondary | ICD-10-CM | POA: Insufficient documentation

## 2016-04-12 DIAGNOSIS — O10913 Unspecified pre-existing hypertension complicating pregnancy, third trimester: Secondary | ICD-10-CM

## 2016-04-12 DIAGNOSIS — Z3A36 36 weeks gestation of pregnancy: Secondary | ICD-10-CM | POA: Diagnosis not present

## 2016-04-12 DIAGNOSIS — O350XX0 Maternal care for (suspected) central nervous system malformation in fetus, not applicable or unspecified: Secondary | ICD-10-CM

## 2016-04-12 DIAGNOSIS — O3509X0 Maternal care for (suspected) other central nervous system malformation or damage in fetus, not applicable or unspecified: Secondary | ICD-10-CM

## 2016-04-12 LAB — COMPREHENSIVE METABOLIC PANEL
ALT: 11 U/L — AB (ref 14–54)
ANION GAP: 8 (ref 5–15)
AST: 16 U/L (ref 15–41)
Albumin: 3.6 g/dL (ref 3.5–5.0)
Alkaline Phosphatase: 123 U/L (ref 38–126)
BUN: 11 mg/dL (ref 6–20)
CHLORIDE: 105 mmol/L (ref 101–111)
CO2: 21 mmol/L — AB (ref 22–32)
CREATININE: 0.45 mg/dL (ref 0.44–1.00)
Calcium: 9.3 mg/dL (ref 8.9–10.3)
Glucose, Bld: 96 mg/dL (ref 65–99)
Potassium: 4.2 mmol/L (ref 3.5–5.1)
SODIUM: 134 mmol/L — AB (ref 135–145)
Total Bilirubin: 0.3 mg/dL (ref 0.3–1.2)
Total Protein: 7.8 g/dL (ref 6.5–8.1)

## 2016-04-12 LAB — PROTEIN / CREATININE RATIO, URINE
Creatinine, Urine: 139 mg/dL
PROTEIN CREATININE RATIO: 0.13 mg/mg{creat} (ref 0.00–0.15)
TOTAL PROTEIN, URINE: 18 mg/dL

## 2016-04-12 LAB — CBC
HCT: 40.2 % (ref 35.0–47.0)
HEMOGLOBIN: 14 g/dL (ref 12.0–16.0)
MCH: 29.7 pg (ref 26.0–34.0)
MCHC: 34.8 g/dL (ref 32.0–36.0)
MCV: 85.3 fL (ref 80.0–100.0)
PLATELETS: 204 10*3/uL (ref 150–440)
RBC: 4.72 MIL/uL (ref 3.80–5.20)
RDW: 13.4 % (ref 11.5–14.5)
WBC: 12.8 10*3/uL — AB (ref 3.6–11.0)

## 2016-04-12 LAB — URIC ACID: URIC ACID, SERUM: 3.9 mg/dL (ref 2.3–6.6)

## 2016-04-12 NOTE — Consult Note (Addendum)
35 yo pt G1P0 at 6136 3/7 returns for BPP  Pregnancy complicated by chronic HTN on meds and gestational diabetes  Initial BP -145/100 Repeat 134/94 more typical for her BPP 8/10 -minus 2 for breathing   NST reactive  baseline 130 moderate variability pos accels no decels  No UCs   Preeclampsia labs sent  Pt scheduled for IOL next week at 37 weeks  I reviewed preeclampsia and labor precautions  [name], Meghan Staten, MD

## 2016-04-19 ENCOUNTER — Ambulatory Visit
Admission: RE | Admit: 2016-04-19 | Discharge: 2016-04-19 | Disposition: A | Discharge status: Home / Self Care | Payer: Medicaid Other | Source: Ambulatory Visit | Attending: Obstetrics & Gynecology | Admitting: Obstetrics & Gynecology

## 2016-04-19 ENCOUNTER — Inpatient Hospital Stay
Admission: RE | Admit: 2016-04-19 | Discharge: 2016-04-25 | DRG: 765 | Disposition: A | Discharge status: Home / Self Care | Payer: Medicaid Other | Attending: Obstetrics and Gynecology | Admitting: Obstetrics and Gynecology

## 2016-04-19 DIAGNOSIS — Z3A37 37 weeks gestation of pregnancy: Secondary | ICD-10-CM | POA: Diagnosis not present

## 2016-04-19 DIAGNOSIS — D62 Acute posthemorrhagic anemia: Secondary | ICD-10-CM | POA: Diagnosis not present

## 2016-04-19 DIAGNOSIS — O134 Gestational [pregnancy-induced] hypertension without significant proteinuria, complicating childbirth: Secondary | ICD-10-CM | POA: Diagnosis present

## 2016-04-19 DIAGNOSIS — Z6833 Body mass index (BMI) 33.0-33.9, adult: Secondary | ICD-10-CM

## 2016-04-19 DIAGNOSIS — Z79899 Other long term (current) drug therapy: Secondary | ICD-10-CM

## 2016-04-19 DIAGNOSIS — O3509X0 Maternal care for (suspected) other central nervous system malformation or damage in fetus, not applicable or unspecified: Secondary | ICD-10-CM

## 2016-04-19 DIAGNOSIS — Z3490 Encounter for supervision of normal pregnancy, unspecified, unspecified trimester: Secondary | ICD-10-CM

## 2016-04-19 DIAGNOSIS — O24425 Gestational diabetes mellitus in childbirth, controlled by oral hypoglycemic drugs: Secondary | ICD-10-CM | POA: Diagnosis present

## 2016-04-19 DIAGNOSIS — O350XX0 Maternal care for (suspected) central nervous system malformation in fetus, not applicable or unspecified: Secondary | ICD-10-CM

## 2016-04-19 DIAGNOSIS — O99214 Obesity complicating childbirth: Secondary | ICD-10-CM | POA: Diagnosis present

## 2016-04-19 DIAGNOSIS — Z7982 Long term (current) use of aspirin: Secondary | ICD-10-CM | POA: Diagnosis not present

## 2016-04-19 DIAGNOSIS — O163 Unspecified maternal hypertension, third trimester: Secondary | ICD-10-CM | POA: Diagnosis present

## 2016-04-19 DIAGNOSIS — O24419 Gestational diabetes mellitus in pregnancy, unspecified control: Secondary | ICD-10-CM

## 2016-04-19 LAB — COMPREHENSIVE METABOLIC PANEL
ALT: 12 U/L — ABNORMAL LOW (ref 14–54)
ANION GAP: 4 — AB (ref 5–15)
AST: 18 U/L (ref 15–41)
Albumin: 3.2 g/dL — ABNORMAL LOW (ref 3.5–5.0)
Alkaline Phosphatase: 105 U/L (ref 38–126)
BILIRUBIN TOTAL: 0.2 mg/dL — AB (ref 0.3–1.2)
BUN: 16 mg/dL (ref 6–20)
CO2: 23 mmol/L (ref 22–32)
Calcium: 9.2 mg/dL (ref 8.9–10.3)
Chloride: 108 mmol/L (ref 101–111)
Creatinine, Ser: 0.6 mg/dL (ref 0.44–1.00)
Glucose, Bld: 85 mg/dL (ref 65–99)
POTASSIUM: 4.8 mmol/L (ref 3.5–5.1)
Sodium: 135 mmol/L (ref 135–145)
TOTAL PROTEIN: 7.1 g/dL (ref 6.5–8.1)

## 2016-04-19 LAB — LACTATE DEHYDROGENASE: LDH: 161 U/L (ref 98–192)

## 2016-04-19 LAB — CBC
HEMATOCRIT: 37.6 % (ref 35.0–47.0)
Hemoglobin: 13.1 g/dL (ref 12.0–16.0)
MCH: 29.8 pg (ref 26.0–34.0)
MCHC: 34.8 g/dL (ref 32.0–36.0)
MCV: 85.7 fL (ref 80.0–100.0)
Platelets: 200 10*3/uL (ref 150–440)
RBC: 4.39 MIL/uL (ref 3.80–5.20)
RDW: 13.6 % (ref 11.5–14.5)
WBC: 13.9 10*3/uL — ABNORMAL HIGH (ref 3.6–11.0)

## 2016-04-19 LAB — TYPE AND SCREEN
ABO/RH(D): O NEG
ANTIBODY SCREEN: NEGATIVE

## 2016-04-19 LAB — URIC ACID: Uric Acid, Serum: 4.1 mg/dL (ref 2.3–6.6)

## 2016-04-19 LAB — GLUCOSE, CAPILLARY: Glucose-Capillary: 81 mg/dL (ref 65–99)

## 2016-04-19 MED ORDER — LIDOCAINE HCL (PF) 1 % IJ SOLN: 30.0000 mL | INTRAMUSCULAR | Status: DC | PRN

## 2016-04-19 MED ORDER — OXYTOCIN 40 UNITS IN LACTATED RINGERS INFUSION - SIMPLE MED
2.5000 [IU]/h | INTRAVENOUS | Status: DC
  Filled 2016-04-19: qty 1000

## 2016-04-19 MED ORDER — LACTATED RINGERS IV SOLN
INTRAVENOUS | Status: DC
  Administered 2016-04-19: 21:00:00 via INTRAVENOUS
  Administered 2016-04-20: 125 mL/h via INTRAVENOUS
  Administered 2016-04-20: 07:00:00 via INTRAVENOUS
  Administered 2016-04-21 (×2): 125 mL/h via INTRAVENOUS

## 2016-04-19 MED ORDER — BUTORPHANOL TARTRATE 1 MG/ML IJ SOLN: 1.0000 mg | INTRAMUSCULAR | Status: DC | PRN

## 2016-04-19 MED ORDER — PENICILLIN G POTASSIUM 5000000 UNITS IJ SOLR
5.0000 10*6.[IU] | Freq: Once | INTRAVENOUS | Status: AC
  Administered 2016-04-19: 5 10*6.[IU] via INTRAVENOUS
  Filled 2016-04-19: qty 5

## 2016-04-19 MED ORDER — LACTATED RINGERS IV SOLN
500.0000 mL | INTRAVENOUS | Status: DC | PRN
  Administered 2016-04-21: 500 mL via INTRAVENOUS

## 2016-04-19 MED ORDER — DINOPROSTONE 10 MG VA INST
10.0000 mg | VAGINAL_INSERT | Freq: Once | VAGINAL | Status: AC
  Administered 2016-04-19: 10 mg via VAGINAL
  Filled 2016-04-19: qty 1

## 2016-04-19 MED ORDER — LABETALOL HCL 5 MG/ML IV SOLN
20.0000 mg | INTRAVENOUS | Status: DC | PRN
  Filled 2016-04-19: qty 4

## 2016-04-19 MED ORDER — ASPIRIN 81 MG PO CHEW
81.0000 mg | CHEWABLE_TABLET | Freq: Every day | ORAL | Status: DC
  Administered 2016-04-20: 81 mg via ORAL
  Filled 2016-04-19 (×2): qty 1

## 2016-04-19 MED ORDER — NIFEDIPINE 10 MG PO CAPS: 20.0000 mg | ORAL_CAPSULE | Freq: Three times a day (TID) | ORAL | Status: DC

## 2016-04-19 MED ORDER — ZOLPIDEM TARTRATE 5 MG PO TABS
5.0000 mg | ORAL_TABLET | Freq: Every evening | ORAL | Status: DC | PRN
  Administered 2016-04-19: 5 mg via ORAL
  Filled 2016-04-19: qty 1

## 2016-04-19 MED ORDER — INFLUENZA VAC SPLIT QUAD 0.5 ML IM SUSY
0.5000 mL | PREFILLED_SYRINGE | INTRAMUSCULAR | Status: DC
  Filled 2016-04-19: qty 0.5

## 2016-04-19 MED ORDER — OXYTOCIN BOLUS FROM INFUSION: 500.0000 mL | Freq: Once | INTRAVENOUS | Status: DC

## 2016-04-19 MED ORDER — PENICILLIN G POTASSIUM 5000000 UNITS IJ SOLR
2.5000 10*6.[IU] | INTRAMUSCULAR | Status: DC
  Administered 2016-04-20 – 2016-04-22 (×16): 2.5 10*6.[IU] via INTRAVENOUS
  Filled 2016-04-19 (×27): qty 2.5

## 2016-04-19 MED ORDER — ONDANSETRON HCL 4 MG/2ML IJ SOLN
4.0000 mg | Freq: Four times a day (QID) | INTRAMUSCULAR | Status: DC | PRN
  Administered 2016-04-21: 4 mg via INTRAVENOUS
  Filled 2016-04-19: qty 2

## 2016-04-19 MED ORDER — SOD CITRATE-CITRIC ACID 500-334 MG/5ML PO SOLN: 30.0000 mL | ORAL | Status: DC | PRN

## 2016-04-19 MED ORDER — ACETAMINOPHEN 325 MG PO TABS: 650.0000 mg | ORAL_TABLET | ORAL | Status: DC | PRN

## 2016-04-19 NOTE — H&P (Addendum)
Costella HatcherDana Christiana is a 35 y.o. female presenting for induction of labor for poorly controlled HTN , gestational GDM , and fetal ventriculomegaly. EDC 05/10/16 . 37+0 weeks . Currently on procardiaxl 90 mg qd , glyburide 2.5 mg bid , and 81asa .  OB History    Gravida Para Term Preterm AB Living   1             SAB TAB Ectopic Multiple Live Births                 Past Medical History:  Diagnosis Date  . Anxiety   . Gestational diabetes   . Headache    mirgraines started around the age of 35/36 years old  . Hypertension    Past Surgical History:  Procedure Laterality Date  . NO PAST SURGERIES     Family History: family history includes Diabetes in her father; Heart attack in her paternal grandmother; Hypertension in her mother; Neurologic Disorder in her mother. Social History:  reports that she has never smoked. She has never used smokeless tobacco. She reports that she does not drink alcohol or use drugs.     Maternal Diabetes: Yes:  Diabetes Type:  Insulin/Medication controlled Genetic Screening: Normal Maternal Ultrasounds/Referrals: Fetal Ultrasounds or other Referrals:  Other: mild ventriculomegaly followed by MFM  Maternal Substance Abuse:  No Significant Maternal Medications: procardia , glyburide ,asa  Significant Maternal Lab Results:  None Other Comments:  None  ROS History   Last menstrual period 08/03/2015. Exam Physical Exam  Lungs CTA  CV RRR  adb gravid  Prenatal labs: ABO, Rh:  O- Antibody:  neg Rubella:  imm RPR:   neg HBsAg:  neg  HIV:   neg GBS:   +  Assessment/Plan:  37+0 weeks , Chtn poorly controlled on multiple hypertension meds, gestational diabetes . + GBS Fetal ventriculomegaly P:cervidil induction  GBS prophylaxis Procardia 20 mg po TID + labetalol 20 mg IV to keep SBP <160 and or DBP <100  Notify peds of ventriculomegaly in am   Percy Winterrowd 04/19/2016, 9:52 PM

## 2016-04-20 ENCOUNTER — Inpatient Hospital Stay: Payer: Medicaid Other | Admitting: Anesthesiology

## 2016-04-20 LAB — CBC
HEMATOCRIT: 41.2 % (ref 35.0–47.0)
Hemoglobin: 13.9 g/dL (ref 12.0–16.0)
MCH: 29.1 pg (ref 26.0–34.0)
MCHC: 33.8 g/dL (ref 32.0–36.0)
MCV: 86.1 fL (ref 80.0–100.0)
PLATELETS: 201 10*3/uL (ref 150–440)
RBC: 4.79 MIL/uL (ref 3.80–5.20)
RDW: 13.6 % (ref 11.5–14.5)
WBC: 13.6 10*3/uL — AB (ref 3.6–11.0)

## 2016-04-20 LAB — PROTEIN / CREATININE RATIO, URINE
CREATININE, URINE: 138 mg/dL
PROTEIN CREATININE RATIO: 0.14 mg/mg{creat} (ref 0.00–0.15)
TOTAL PROTEIN, URINE: 20 mg/dL

## 2016-04-20 MED ORDER — LIDOCAINE HCL (PF) 1 % IJ SOLN
INTRAMUSCULAR | Status: DC | PRN
  Administered 2016-04-20: 3 mL via SUBCUTANEOUS

## 2016-04-20 MED ORDER — MEPERIDINE HCL 25 MG/ML IJ SOLN: 6.2500 mg | INTRAMUSCULAR | Status: DC | PRN

## 2016-04-20 MED ORDER — NALOXONE HCL 0.4 MG/ML IJ SOLN: 0.4000 mg | INTRAMUSCULAR | Status: DC | PRN

## 2016-04-20 MED ORDER — AMMONIA AROMATIC IN INHA
RESPIRATORY_TRACT | Status: AC
  Filled 2016-04-20: qty 10

## 2016-04-20 MED ORDER — KETOROLAC TROMETHAMINE 30 MG/ML IJ SOLN: 30.0000 mg | Freq: Four times a day (QID) | INTRAMUSCULAR | Status: AC | PRN

## 2016-04-20 MED ORDER — FENTANYL 2.5 MCG/ML W/ROPIVACAINE 0.2% IN NS 100 ML EPIDURAL INFUSION (ARMC-ANES)
10.0000 mL/h | EPIDURAL | Status: DC
  Administered 2016-04-21 – 2016-04-22 (×4): 10 mL/h via EPIDURAL
  Filled 2016-04-20 (×5): qty 100

## 2016-04-20 MED ORDER — ACETAMINOPHEN 325 MG PO TABS: 650.0000 mg | ORAL_TABLET | Freq: Four times a day (QID) | ORAL | Status: AC

## 2016-04-20 MED ORDER — NALOXONE HCL 2 MG/2ML IJ SOSY
1.0000 ug/kg/h | PREFILLED_SYRINGE | INTRAVENOUS | Status: DC | PRN
  Filled 2016-04-20: qty 2

## 2016-04-20 MED ORDER — MISOPROSTOL 200 MCG PO TABS
ORAL_TABLET | ORAL | Status: AC
  Filled 2016-04-20: qty 4

## 2016-04-20 MED ORDER — SCOPOLAMINE 1 MG/3DAYS TD PT72: 1.0000 | MEDICATED_PATCH | Freq: Once | TRANSDERMAL | Status: DC

## 2016-04-20 MED ORDER — ONDANSETRON HCL 4 MG/2ML IJ SOLN: 4.0000 mg | Freq: Three times a day (TID) | INTRAMUSCULAR | Status: DC | PRN

## 2016-04-20 MED ORDER — NALBUPHINE HCL 10 MG/ML IJ SOLN: 5.0000 mg | INTRAMUSCULAR | Status: DC | PRN

## 2016-04-20 MED ORDER — LIDOCAINE-EPINEPHRINE (PF) 1.5 %-1:200000 IJ SOLN
INTRAMUSCULAR | Status: DC | PRN
  Administered 2016-04-20: 3 mL via EPIDURAL

## 2016-04-20 MED ORDER — LIDOCAINE HCL (PF) 1 % IJ SOLN
INTRAMUSCULAR | Status: AC
  Filled 2016-04-20: qty 30

## 2016-04-20 MED ORDER — NALBUPHINE HCL 10 MG/ML IJ SOLN: 5.0000 mg | Freq: Once | INTRAMUSCULAR | Status: DC | PRN

## 2016-04-20 MED ORDER — BUPIVACAINE HCL (PF) 0.25 % IJ SOLN
INTRAMUSCULAR | Status: DC | PRN
  Administered 2016-04-20 (×2): 5 mL via EPIDURAL

## 2016-04-20 MED ORDER — TERBUTALINE SULFATE 1 MG/ML IJ SOLN: 0.2500 mg | Freq: Once | INTRAMUSCULAR | Status: DC | PRN

## 2016-04-20 MED ORDER — FENTANYL 2.5 MCG/ML W/ROPIVACAINE 0.2% IN NS 100 ML EPIDURAL INFUSION (ARMC-ANES)
EPIDURAL | Status: DC | PRN
  Administered 2016-04-20: 10 mL/h via EPIDURAL

## 2016-04-20 MED ORDER — DIPHENHYDRAMINE HCL 25 MG PO CAPS: 25.0000 mg | ORAL_CAPSULE | ORAL | Status: DC | PRN

## 2016-04-20 MED ORDER — SODIUM CHLORIDE 0.9% FLUSH: 3.0000 mL | INTRAVENOUS | Status: DC | PRN

## 2016-04-20 MED ORDER — OXYTOCIN 10 UNIT/ML IJ SOLN
INTRAMUSCULAR | Status: AC
  Filled 2016-04-20: qty 2

## 2016-04-20 MED ORDER — OXYTOCIN 40 UNITS IN LACTATED RINGERS INFUSION - SIMPLE MED
1.0000 m[IU]/min | INTRAVENOUS | Status: DC
  Administered 2016-04-20: 9 m[IU]/min via INTRAVENOUS
  Administered 2016-04-20: 1 m[IU]/min via INTRAVENOUS
  Filled 2016-04-20: qty 1000

## 2016-04-20 MED ORDER — FENTANYL 2.5 MCG/ML W/ROPIVACAINE 0.2% IN NS 100 ML EPIDURAL INFUSION (ARMC-ANES)
EPIDURAL | Status: AC
  Filled 2016-04-20: qty 100

## 2016-04-20 MED ORDER — DIPHENHYDRAMINE HCL 50 MG/ML IJ SOLN: 12.5000 mg | INTRAMUSCULAR | Status: DC | PRN

## 2016-04-20 NOTE — Anesthesia Preprocedure Evaluation (Signed)
Anesthesia Evaluation  Patient identified by MRN, date of birth, ID band Patient awake    Reviewed: Allergy & Precautions, H&P , NPO status   History of Anesthesia Complications Negative for: history of anesthetic complications  Airway Mallampati: II       Dental   Pulmonary           Cardiovascular hypertension,      Neuro/Psych  Headaches,    GI/Hepatic   Endo/Other  diabetes, Gestational  Renal/GU      Musculoskeletal   Abdominal   Peds  Hematology   Anesthesia Other Findings   Reproductive/Obstetrics (+) Pregnancy                             Anesthesia Physical Anesthesia Plan  ASA: II  Anesthesia Plan: Epidural   Post-op Pain Management:    Induction:   Airway Management Planned:   Additional Equipment:   Intra-op Plan:   Post-operative Plan:   Informed Consent: I have reviewed the patients History and Physical, chart, labs and discussed the procedure including the risks, benefits and alternatives for the proposed anesthesia with the patient or authorized representative who has indicated his/her understanding and acceptance.     Plan Discussed with: Anesthesiologist  Anesthesia Plan Comments:         Anesthesia Quick Evaluation

## 2016-04-20 NOTE — Progress Notes (Addendum)
Pt. Off monitor up to shower for morning ADLs. Upon entering the room pt sitting on side of bed talking to spouse - maternal pulse recording. IV site without s/s of infiltrate, currently infusing LR at 125/hr, PCN 2.5 million units q4hr hung. Cervidil remains in place; due to be removed around 1000 this morning. Pain 0/10.

## 2016-04-20 NOTE — Progress Notes (Signed)
CBG- 64 mg/dl

## 2016-04-20 NOTE — Progress Notes (Signed)
CBG - 86 mg/dl

## 2016-04-20 NOTE — Anesthesia Procedure Notes (Signed)
Epidural Patient location during procedure: OB Start time: 04/20/2016 2:42 PM End time: 04/20/2016 2:55 PM  Staffing Anesthesiologist: Priscella MannPENWARDEN, AMY Resident/CRNA: Ebin Palazzi Performed: resident/CRNA   Preanesthetic Checklist Completed: patient identified, site marked, surgical consent, pre-op evaluation, IV checked, risks and benefits discussed and monitors and equipment checked  Epidural Patient position: sitting Prep: Betadine Patient monitoring: continuous pulse ox and blood pressure Approach: midline Location: L3-L4 Injection technique: LOR air  Needle:  Needle type: Tuohy  Needle gauge: 17 G Needle length: 9 cm Needle insertion depth: 7 cm Catheter type: closed end flexible Catheter size: 19 Gauge Catheter at skin depth: 12 cm Test dose: negative and 1.5% lidocaine with Epi 1:200 K  Assessment Events: blood not aspirated, injection not painful, no injection resistance, negative IV test and no paresthesia  Additional Notes Reason for block:procedure for pain

## 2016-04-20 NOTE — Progress Notes (Signed)
Costella HatcherDana Oien is a 35 y.o. G1P0 at 726w4d for iol/gHTN, gDMA2, ventriculomegaly  Subjective: Doing well with epidural  Objective: BP 129/72   Pulse 86   Temp 97.6 F (36.4 C)   Resp 18   Ht 5\' 6"  (1.676 m)   Wt 210 lb (95.3 kg)   LMP 08/03/2015   SpO2 98%   BMI 33.89 kg/m  No intake/output data recorded. No intake/output data recorded.  FHT:  FHR: 120 bpm, variability: moderate,  accelerations:  Present,  decelerations:  Absent UC:   regular, every 3 minutes SVE:   Dilation: 1 Effacement (%): 70 Exam by:: Ward, MD  Labs: Lab Results  Component Value Date   WBC 13.9 (H) 04/19/2016   HGB 13.1 04/19/2016   HCT 37.6 04/19/2016   MCV 85.7 04/19/2016   PLT 200 04/19/2016    Assessment / Plan: Induction of labor due to gHTN, gDMA2, ventriculomegaly,  progressing well on pitocin  Labor: Progressing on Pitocin, will continue to increase then AROM and cook cath in place now. Preeclampsia:  labs stable Fetal Wellbeing:  Category I Pain Control:  Epidural Anticipated MOD:  NSVD   Dr Elesa MassedWard was requested by this patient and her husband and she has agreed to be present for delivery.  Christeen DouglasBEASLEY, Rhaelyn Giron 04/20/2016, 4:38 PM

## 2016-04-20 NOTE — Progress Notes (Signed)
CBG - 85 mg/dl

## 2016-04-21 LAB — GLUCOSE, CAPILLARY
GLUCOSE-CAPILLARY: 118 mg/dL — AB (ref 65–99)
GLUCOSE-CAPILLARY: 82 mg/dL (ref 65–99)
GLUCOSE-CAPILLARY: 86 mg/dL (ref 65–99)
GLUCOSE-CAPILLARY: 86 mg/dL (ref 65–99)
Glucose-Capillary: 75 mg/dL (ref 65–99)
Glucose-Capillary: 85 mg/dL (ref 65–99)
Glucose-Capillary: 68 mg/dL (ref 65–99)
Glucose-Capillary: 88 mg/dL (ref 65–99)
Glucose-Capillary: 76 mg/dL (ref 65–99)
Glucose-Capillary: 97 mg/dL (ref 65–99)

## 2016-04-21 LAB — RPR: RPR: NONREACTIVE

## 2016-04-21 MED ORDER — MISOPROSTOL 25 MCG QUARTER TABLET
ORAL_TABLET | ORAL | Status: AC
  Administered 2016-04-21: 25 ug
  Filled 2016-04-21: qty 0.25

## 2016-04-21 MED ORDER — MISOPROSTOL 25 MCG QUARTER TABLET
ORAL_TABLET | ORAL | Status: AC
  Administered 2016-04-21: 50 ug
  Filled 2016-04-21: qty 0.5

## 2016-04-21 MED ORDER — FENTANYL 2.5 MCG/ML W/ROPIVACAINE 0.2% IN NS 100 ML EPIDURAL INFUSION (ARMC-ANES)
EPIDURAL | Status: AC
  Administered 2016-04-21: 10 mL/h via EPIDURAL
  Filled 2016-04-21: qty 100

## 2016-04-21 MED ORDER — DINOPROSTONE 10 MG VA INST
10.0000 mg | VAGINAL_INSERT | Freq: Once | VAGINAL | Status: AC
  Administered 2016-04-21: 10 mg via VAGINAL
  Filled 2016-04-21: qty 1

## 2016-04-21 MED ORDER — MISOPROSTOL 25 MCG QUARTER TABLET
ORAL_TABLET | ORAL | Status: AC
  Filled 2016-04-21: qty 0.5

## 2016-04-21 NOTE — Progress Notes (Signed)
Costella HatcherDana Schrodt is a 35 y.o. G1P0 at 2568w5d by day 3 induction  Foley bulb removed cx 4 cm by Dr Elesa MassedWard . Subjective:   Objective: BP 130/65   Pulse 76   Temp 98.4 F (36.9 C) (Oral)   Resp 18   Ht 5\' 6"  (1.676 m)   Wt 210 lb (95.3 kg)   LMP 08/03/2015   SpO2 98%   BMI 33.89 kg/m  I/O last 3 completed shifts: In: -  Out: 300 [Urine:300] No intake/output data recorded.  FHT:  Cat 1  UC:   irregular, every 3-5 minutes SVE:  4 cm / 50 / -2  FHR cat 1 Labs: Lab Results  Component Value Date   WBC 13.6 (H) 04/20/2016   HGB 13.9 04/20/2016   HCT 41.2 04/20/2016   MCV 86.1 04/20/2016   PLT 201 04/20/2016    Assessment / Plan: CHTN , A2 GDM  Cont induction , will try arom and pitocin   Meilech Virts 04/21/2016, 8:51 AM

## 2016-04-21 NOTE — Progress Notes (Signed)
Meghan Lowe is a 35 y.o. G1P0 at 341w5d by day 3 induction . S/p cytotec 25 mcg without much change of CTX  Subjective:no c/o   Objective: BP (!) 145/89 (BP Location: Right Arm)   Pulse 100   Temp 98.4 F (36.9 C) (Oral)   Resp 18   Ht 5\' 6"  (1.676 m)   Wt 210 lb (95.3 kg)   LMP 08/03/2015   SpO2 98%   BMI 33.89 kg/m  I/O last 3 completed shifts: In: -  Out: 1300 [Urine:1300]3 Total I/O In: -  Out: 910 [Urine:910]  FHT:  FHR: 130 bpm, variability: moderate,  accelerations:  Present,  decelerations:  Absent UC:   irregular, every 10 minutes SVE:   Dilation: 3 Effacement (%): 50 Station: -3 Exam by:: Meghan Offner, MD 50 mcg cytotec placed  Labs: Lab Results  Component Value Date   WBC 13.6 (H) 04/20/2016   HGB 13.9 04/20/2016   HCT 41.2 04/20/2016   MCV 86.1 04/20/2016   PLT 201 04/20/2016    Assessment / Plan: Induction of labor with fetal head too high to AROM  50 mcg cytotec placed  Cont monitoring  Meghan Lowe 04/21/2016, 5:15 PM

## 2016-04-21 NOTE — Progress Notes (Addendum)
CBG at 0800 was 76 mg/dl; CBG 97 mg/dl at [id number].

## 2016-04-21 NOTE — Progress Notes (Signed)
Capillary Blood glucose- 79. Glucose monitor docked but not interfacing with Epic program at present

## 2016-04-21 NOTE — Progress Notes (Signed)
Capillary blood Glucose 88

## 2016-04-21 NOTE — Progress Notes (Signed)
Patient ID: Meghan HatcherDana Lowe, female   DOB: [birth year], 35 y.o.   MRN: [id number] cx : 3/ 50 /-3  25 mcg cytotec placed  Reassuring fetal monitoring  Continue induction

## 2016-04-21 NOTE — Progress Notes (Signed)
[facility] bleeding at insertion site and epidural level improving but still at legs but right side now has a level. MD to bolus epidural to imrpove level. 2210 2.735ml .25% Marcaine given by Dr Karlton LemonKarenz 2212 2.905ml of .25% marcaine given by Dr Karlton LemonKarenz

## 2016-04-22 ENCOUNTER — Encounter
Admission: RE | Disposition: A | Discharge status: Home / Self Care | Payer: Self-pay | Source: Home / Self Care | Attending: Obstetrics and Gynecology

## 2016-04-22 LAB — GLUCOSE, CAPILLARY
GLUCOSE-CAPILLARY: 88 mg/dL (ref 65–99)
GLUCOSE-CAPILLARY: 94 mg/dL (ref 65–99)
Glucose-Capillary: 78 mg/dL (ref 65–99)
Glucose-Capillary: 90 mg/dL (ref 65–99)
Glucose-Capillary: 81 mg/dL (ref 65–99)
Glucose-Capillary: 102 mg/dL — ABNORMAL HIGH (ref 65–99)

## 2016-04-22 SURGERY — Surgical Case

## 2016-04-22 SURGERY — Surgical Case
Anesthesia: Spinal

## 2016-04-22 MED ORDER — OXYTOCIN 40 UNITS IN LACTATED RINGERS INFUSION - SIMPLE MED
1.0000 m[IU]/min | INTRAVENOUS | Status: DC
  Administered 2016-04-22: 1 m[IU]/min via INTRAVENOUS

## 2016-04-22 MED ORDER — BUPIVACAINE HCL (PF) 0.5 % IJ SOLN
INTRAMUSCULAR | Status: AC
  Filled 2016-04-22: qty 30

## 2016-04-22 MED ORDER — AZITHROMYCIN 500 MG IV SOLR
500.0000 mg | INTRAVENOUS | Status: DC
  Administered 2016-04-23 – 2016-04-24 (×2): 500 mg via INTRAVENOUS
  Filled 2016-04-22 (×4): qty 500

## 2016-04-22 MED ORDER — BUPIVACAINE 0.25 % ON-Q PUMP DUAL CATH 400 ML
INJECTION | Status: AC
  Filled 2016-04-22: qty 400

## 2016-04-22 MED ORDER — SOD CITRATE-CITRIC ACID 500-334 MG/5ML PO SOLN
ORAL | Status: AC
  Filled 2016-04-22: qty 15

## 2016-04-22 MED ORDER — DEXTROSE 5 % IV SOLN
3.0000 g | Freq: Once | INTRAVENOUS | Status: AC
  Administered 2016-04-23: 3 g via INTRAVENOUS
  Filled 2016-04-22: qty 3000

## 2016-04-22 SURGICAL SUPPLY — 31 items
CANISTER SUCT 3000ML (MISCELLANEOUS) ×3 IMPLANT
CATH KIT ON-Q SILVERSOAK 5IN (CATHETERS) ×6 IMPLANT
CLOSURE WOUND 1/2 X4 (GAUZE/BANDAGES/DRESSINGS) ×1
DRSG TELFA 3X8 NADH (GAUZE/BANDAGES/DRESSINGS) ×3 IMPLANT
ELECT CAUTERY BLADE 6.4 (BLADE) ×3 IMPLANT
ELECT REM PT RETURN 9FT ADLT (ELECTROSURGICAL) ×3
ELECTRODE REM PT RTRN 9FT ADLT (ELECTROSURGICAL) ×1 IMPLANT
GAUZE SPONGE 4X4 12PLY STRL (GAUZE/BANDAGES/DRESSINGS) ×3 IMPLANT
GLOVE BIOGEL PI IND STRL 6.5 (GLOVE) ×6 IMPLANT
GLOVE BIOGEL PI INDICATOR 6.5 (GLOVE) ×12
GLOVE SURG SYN 6.5 ES PF (GLOVE) ×3 IMPLANT
GOWN STRL REUS W/ TWL LRG LVL3 (GOWN DISPOSABLE) ×3 IMPLANT
GOWN STRL REUS W/TWL LRG LVL3 (GOWN DISPOSABLE) ×6
HEMOSTAT ARISTA ABSORB 1G (MISCELLANEOUS) ×3 IMPLANT
LIQUID BAND (GAUZE/BANDAGES/DRESSINGS) ×3 IMPLANT
NS IRRIG 1000ML POUR BTL (IV SOLUTION) ×3 IMPLANT
PACK C SECTION AR (MISCELLANEOUS) ×3 IMPLANT
PAD OB MATERNITY 4.3X12.25 (PERSONAL CARE ITEMS) ×3 IMPLANT
PAD PREP 24X41 OB/GYN DISP (PERSONAL CARE ITEMS) ×3 IMPLANT
STRAP SAFETY BODY (MISCELLANEOUS) ×3 IMPLANT
STRIP CLOSURE SKIN 1/2X4 (GAUZE/BANDAGES/DRESSINGS) ×2 IMPLANT
SUT MNCRL 4-0 (SUTURE) ×2
SUT MNCRL 4-0 27XMFL (SUTURE) ×1
SUT PDS AB 1 TP1 96 (SUTURE) ×3 IMPLANT
SUT VIC AB 0 CT1 36 (SUTURE) ×6 IMPLANT
SUT VIC AB 2-0 CT1 27 (SUTURE) ×2
SUT VIC AB 2-0 CT1 TAPERPNT 27 (SUTURE) ×1 IMPLANT
SUT VIC AB 3-0 SH 27 (SUTURE) ×2
SUT VIC AB 3-0 SH 27X BRD (SUTURE) ×1 IMPLANT
SUTURE MNCRL 4-0 27XMF (SUTURE) ×1 IMPLANT
SWABSTK COMLB BENZOIN TINCTURE (MISCELLANEOUS) ×3 IMPLANT

## 2016-04-22 SURGICAL SUPPLY — 30 items
CANISTER SUCT 3000ML (MISCELLANEOUS) IMPLANT
CATH KIT ON-Q SILVERSOAK 5IN (CATHETERS) IMPLANT
CLOSURE WOUND 1/2 X4 (GAUZE/BANDAGES/DRESSINGS)
DRSG TELFA 3X8 NADH (GAUZE/BANDAGES/DRESSINGS) IMPLANT
ELECT CAUTERY BLADE 6.4 (BLADE) IMPLANT
ELECT REM PT RETURN 9FT ADLT (ELECTROSURGICAL)
ELECTRODE REM PT RTRN 9FT ADLT (ELECTROSURGICAL) IMPLANT
GAUZE SPONGE 4X4 12PLY STRL (GAUZE/BANDAGES/DRESSINGS) IMPLANT
GLOVE BIOGEL PI IND STRL 6.5 (GLOVE) IMPLANT
GLOVE BIOGEL PI INDICATOR 6.5 (GLOVE)
GLOVE SURG SYN 6.5 ES PF (GLOVE) IMPLANT
GOWN STRL REUS W/ TWL LRG LVL3 (GOWN DISPOSABLE) IMPLANT
GOWN STRL REUS W/TWL LRG LVL3 (GOWN DISPOSABLE)
LIQUID BAND (GAUZE/BANDAGES/DRESSINGS) IMPLANT
NS IRRIG 1000ML POUR BTL (IV SOLUTION) IMPLANT
PACK C SECTION AR (MISCELLANEOUS) IMPLANT
PAD OB MATERNITY 4.3X12.25 (PERSONAL CARE ITEMS) IMPLANT
PAD PREP 24X41 OB/GYN DISP (PERSONAL CARE ITEMS) IMPLANT
STRAP SAFETY BODY (MISCELLANEOUS) IMPLANT
STRIP CLOSURE SKIN 1/2X4 (GAUZE/BANDAGES/DRESSINGS) IMPLANT
SUT MNCRL 4-0 (SUTURE)
SUT MNCRL 4-0 27XMFL (SUTURE)
SUT PDS AB 1 TP1 96 (SUTURE) IMPLANT
SUT VIC AB 0 CT1 36 (SUTURE) IMPLANT
SUT VIC AB 2-0 CT1 27 (SUTURE)
SUT VIC AB 2-0 CT1 TAPERPNT 27 (SUTURE) IMPLANT
SUT VIC AB 3-0 SH 27 (SUTURE)
SUT VIC AB 3-0 SH 27X BRD (SUTURE) IMPLANT
SUTURE MNCRL 4-0 27XMF (SUTURE) IMPLANT
SWABSTK COMLB BENZOIN TINCTURE (MISCELLANEOUS) IMPLANT

## 2016-04-22 NOTE — Progress Notes (Signed)
Pt has no dermatone on right and at thigh on left. Dr Karlton LemonKarenz called, informed of inadequate level and bleeding at epidural insertion site, Order received to increase epidural rate to 7120ml/hr for one hour. Epidural rate incresaed, will continue to monitor dermatone.

## 2016-04-22 NOTE — Progress Notes (Signed)
S: Resting comfortably with epidural. + CTX, + LOF, no VB O: Vitals:   04/22/16 0447 04/22/16 0820 04/22/16 0904 04/22/16 1115  BP: 121/63     Pulse: 76     Resp:  17 17   Temp:  98.5 F (36.9 C)  98.2 F (36.8 C)  TempSrc:    Oral  SpO2:      Weight:      Height:         Gen: NAD, AAOx3      Abd: FNTTP      Ext: Non-tender, Nonedmeatous    FHT:  mod var + accelerations no decelerations TOCO: Q q1-2 min, adeq MVU's SVE:5/90/vtx-1 (strip erroneous during vag exam due to a 2 mins UC but, not real)   A/P:  35 y.o. yo G1P0 at 504w6d forIOL  Labor: progressing  FWB: Reassuring Cat 1 tracing.   GBS:pos       Antic SVD. Pt is making cx change and is afrebrile,Cat 1 strip and will continue with Pitocin per protocol. Dr Elesa MassedWard wants to deliver pt and will be called while pushing.     Sharee Pimplearon W Naresh Althaus 12:35 PM

## 2016-04-22 NOTE — Progress Notes (Signed)
Glucose is ranging from 78 to 90.

## 2016-04-22 NOTE — Progress Notes (Signed)
[facility] Jones, CNM at the bedside for SVE, remove cervidil, place IUPC, attempt at FSE (unsuccessful) external u/s remains in use.

## 2016-04-22 NOTE — Progress Notes (Signed)
S: Resting comfortably with  epidural. + CTX, + LOF, no VB O: Vitals:   04/22/16 0447 04/22/16 0820 04/22/16 0904 04/22/16 1115  BP: 121/63     Pulse: 76     Resp:  17 17   Temp:  98.5 F (36.9 C)  98.2 F (36.8 C)  TempSrc:    Oral  SpO2:      Weight:      Height:         Gen: NAD, AAOx3      Abd: FNTTP      Ext: Non-tender, Nonedmeatous    FHT +mod var + accelerations no decelerations TOCO: Q1-3  min SVE: not re-evaluated   A/P:  35 y.o. yo G1P0 at 8039w6d for A2GDM, Obesity here for IOL   Labor: pattern improving and now MVU's adeq  FWB: Reassuring Cat 1 tracing.   GBS: pos  Continue to monitor UC/FHT's   Sharee Pimplearon W Jones 11:23 AM

## 2016-04-22 NOTE — Progress Notes (Signed)
S:Pt is tired and upset due to cx not changing. O: Vitals:   04/22/16 2007 04/22/16 2107 04/22/16 2130 04/22/16 2207  BP: 122/78 (!) 141/81  137/83  Pulse: 82 85  82  Resp:      Temp:   98.6 F (37 C)   TempSrc:   Oral   SpO2:      Weight:      Height:         Gen: NAD, AAOx3      Abd: FNTTP      Ext: Non-tender, Nonedmeatous    FHT:+ mod var + accelerations no decelerations TOCO: Q 2-6 mins  SVE: 5/90/vtx-2   A/P:  35 y.o. yo G1P0 at 9382w6d for IOL due to A2GDM.   Labor: failed after 11 hours of adequate MVU's  FWB: Reassuring Cat 1 tracing.  :     Sharee Pimplearon W Mable Lashley 11:17 PM

## 2016-04-22 NOTE — Progress Notes (Signed)
Pitocin rated decreased to 8mu, due to increased MVUs. Provider notified and  in agreement.  Abd. Soft between contractions, no increased in discharge noted. Pain 0/10.

## 2016-04-22 NOTE — Progress Notes (Signed)
S: Resting comfortably with  epidural. No  CTX, no LOF, VB. Pt has rested during the night.  O: Vitals:   04/21/16 2244 04/21/16 2259 04/22/16 0445 04/22/16 0447  BP: 119/63 128/69 121/63 121/63  Pulse: 79 90 76 76  Resp:      Temp: 98.4 F (36.9 C)     TempSrc: Oral     SpO2:      Weight:      Height:         Gen: NAD, AAOx3      Abd: FNTTP      Ext: Non-tender, Nonedmeatous    FHT: +mod var + accelerations no decelerations TOCO: Q 3min SVE: 3/80%/vtx-2   A/P:  35 y.o. yo G1P0 at 1950w6d for IOL due to A2GDM on Glyburide 2.5 mg and fetus has ventriculomegaly.    Labor: Cervidil out as it was not working  FWB: Reassuring Cat 1 tracing. EFW 7#14oz  GBS: pos on PCN  Continue to monitor UC/FHT's.  Start Pitocin per protocol.    Sharee Pimplearon W Rilla Buckman 8:15 AM

## 2016-04-22 NOTE — Progress Notes (Signed)
Decision for cesarean delivery  Patient has had cervical ripening, AROM, and pitocin induction since 9/18.  She is s/p cervidil, pitocin with foley bulb, cytotec, AROM and pitocin with IUPC indicating adequate MVUs although cervix still only at 5cm.   Neither patient nor fetus show signs of compromise, however maternal exhaustion and frustration have increased.  After discussion with CNM, the decision to proceed with primary cesarean delivery due to failed induction of labor was made.  Her induction was for GDMA2, and poorly controlled HTN.   She has been stable on both fronts during her inpatient stay.  Risks of bleeding, injury to surrounding organs, infection, and blood clot were reviewed.  She wishes to proceed, and consents were signed.  Peds, anesthesia, and OR staff have been notified and will report to OR when ready.   ----- Ranae Plumberhelsea Ward, MD Attending Obstetrician and Gynecologist [facility], Department of OB/GYN [facility]

## 2016-04-22 NOTE — Progress Notes (Signed)
S: Resting comfortably with epidural. + CTX, no LOF, VB O: Vitals:   04/22/16 1100 04/22/16 1115 04/22/16 1330 04/22/16 1405  BP:      Pulse:      Resp:    17  Temp:  98.2 F (36.8 C) 98.3 F (36.8 C) 98.3 F (36.8 C)  TempSrc:  Oral Oral Oral  SpO2: 98%     Weight:      Height:         Gen: NAD, AAOx3      Abd: FNTTP      Ext: Non-tender, Nonedmeatous    FHT:+ mod var + accelerations no decelerations TOCO: Q 1.5-2.5 min, MVU's 250 with minus of RT SVE: not re-evaluated   A/P:  35 y.o. yo G1P0 at 620w6d for IOL for A2GDM, Obesity  Labor: IOL with Pitocin  FWB: Reassuring Cat 1 tracing.   GBS:pos  Sharee Pimplearon W At[place] Kimberlin 2:46 PM

## 2016-04-22 NOTE — Progress Notes (Signed)
Epidural rate back to 5210ml/hr. Dr Karlton LemonKarenz notified of continued inadequate dermatone.

## 2016-04-23 ENCOUNTER — Encounter: Payer: Self-pay | Admitting: *Deleted

## 2016-04-23 LAB — CBC WITH DIFFERENTIAL/PLATELET
Basophils Absolute: 0.1 10*3/uL (ref 0–0.1)
Basophils Relative: 1 %
EOS ABS: 0.1 10*3/uL (ref 0–0.7)
EOS PCT: 1 %
HCT: 37.7 % (ref 35.0–47.0)
Hemoglobin: 13 g/dL (ref 12.0–16.0)
LYMPHS ABS: 1.6 10*3/uL (ref 1.0–3.6)
Lymphocytes Relative: 9 %
MCH: 29.6 pg (ref 26.0–34.0)
MCHC: 34.4 g/dL (ref 32.0–36.0)
MCV: 86 fL (ref 80.0–100.0)
MONOS PCT: 9 %
Monocytes Absolute: 1.6 10*3/uL — ABNORMAL HIGH (ref 0.2–0.9)
Neutro Abs: 14.8 10*3/uL — ABNORMAL HIGH (ref 1.4–6.5)
Neutrophils Relative %: 80 %
PLATELETS: 178 10*3/uL (ref 150–440)
RBC: 4.38 MIL/uL (ref 3.80–5.20)
RDW: 13.6 % (ref 11.5–14.5)
WBC: 18.2 10*3/uL — AB (ref 3.6–11.0)

## 2016-04-23 LAB — GLUCOSE, CAPILLARY: Glucose-Capillary: 89 mg/dL (ref 65–99)

## 2016-04-23 MED ORDER — OXYTOCIN 40 UNITS IN LACTATED RINGERS INFUSION - SIMPLE MED: 2.5000 [IU]/h | INTRAVENOUS | Status: AC

## 2016-04-23 MED ORDER — BUPIVACAINE IN DEXTROSE 0.75-8.25 % IT SOLN
INTRATHECAL | Status: DC | PRN
  Administered 2016-04-23: 1.6 mL via INTRATHECAL

## 2016-04-23 MED ORDER — KETOROLAC TROMETHAMINE 30 MG/ML IJ SOLN
INTRAMUSCULAR | Status: AC
  Filled 2016-04-23: qty 1

## 2016-04-23 MED ORDER — OXYCODONE HCL 5 MG PO TABS
5.0000 mg | ORAL_TABLET | ORAL | Status: DC | PRN
  Administered 2016-04-24: 5 mg via ORAL
  Filled 2016-04-23: qty 1

## 2016-04-23 MED ORDER — DIPHENHYDRAMINE HCL 25 MG PO CAPS: 25.0000 mg | ORAL_CAPSULE | ORAL | Status: DC | PRN

## 2016-04-23 MED ORDER — IBUPROFEN 600 MG PO TABS
600.0000 mg | ORAL_TABLET | Freq: Four times a day (QID) | ORAL | Status: DC | PRN
  Administered 2016-04-25: 600 mg via ORAL
  Filled 2016-04-23 (×4): qty 1

## 2016-04-23 MED ORDER — NALBUPHINE HCL 10 MG/ML IJ SOLN: 5.0000 mg | Freq: Once | INTRAMUSCULAR | Status: DC | PRN

## 2016-04-23 MED ORDER — FENTANYL CITRATE (PF) 100 MCG/2ML IJ SOLN
INTRAMUSCULAR | Status: DC | PRN
  Administered 2016-04-23: 15 ug via INTRATHECAL

## 2016-04-23 MED ORDER — DIBUCAINE 1 % RE OINT: 1.0000 "application " | TOPICAL_OINTMENT | RECTAL | Status: DC | PRN

## 2016-04-23 MED ORDER — ACETAMINOPHEN 325 MG PO TABS
650.0000 mg | ORAL_TABLET | Freq: Four times a day (QID) | ORAL | Status: DC
  Administered 2016-04-23: 650 mg via ORAL

## 2016-04-23 MED ORDER — KETOROLAC TROMETHAMINE 30 MG/ML IJ SOLN
30.0000 mg | Freq: Four times a day (QID) | INTRAMUSCULAR | Status: DC
  Administered 2016-04-23: 30 mg via INTRAVENOUS

## 2016-04-23 MED ORDER — LACTATED RINGERS IV SOLN
INTRAVENOUS | Status: DC
  Administered 2016-04-23: 23:00:00 via INTRAVENOUS

## 2016-04-23 MED ORDER — BUPIVACAINE HCL 0.5 % IJ SOLN
INTRAMUSCULAR | Status: DC | PRN
  Administered 2016-04-23: 10 mL

## 2016-04-23 MED ORDER — PHENYLEPHRINE HCL 10 MG/ML IJ SOLN
INTRAMUSCULAR | Status: DC | PRN
  Administered 2016-04-23 (×18): 100 ug via INTRAVENOUS

## 2016-04-23 MED ORDER — MENTHOL 3 MG MT LOZG
1.0000 | LOZENGE | OROMUCOSAL | Status: DC | PRN
Start: 2016-04-23 — End: 2016-04-25

## 2016-04-23 MED ORDER — ACETAMINOPHEN 325 MG PO TABS
650.0000 mg | ORAL_TABLET | Freq: Four times a day (QID) | ORAL | Status: DC
  Administered 2016-04-23 – 2016-04-24 (×4): 650 mg via ORAL
  Filled 2016-04-23 (×4): qty 2

## 2016-04-23 MED ORDER — MORPHINE SULFATE (PF) 0.5 MG/ML IJ SOLN
INTRAMUSCULAR | Status: DC | PRN
  Administered 2016-04-23: .1 mg via INTRATHECAL

## 2016-04-23 MED ORDER — ONDANSETRON HCL 4 MG/2ML IJ SOLN: 4.0000 mg | Freq: Three times a day (TID) | INTRAMUSCULAR | Status: DC | PRN

## 2016-04-23 MED ORDER — SIMETHICONE 80 MG PO CHEW
80.0000 mg | CHEWABLE_TABLET | Freq: Three times a day (TID) | ORAL | Status: DC
  Administered 2016-04-23 – 2016-04-25 (×8): 80 mg via ORAL
  Filled 2016-04-23 (×9): qty 1

## 2016-04-23 MED ORDER — MEPERIDINE HCL 25 MG/ML IJ SOLN: 6.2500 mg | INTRAMUSCULAR | Status: DC | PRN

## 2016-04-23 MED ORDER — KETOROLAC TROMETHAMINE 30 MG/ML IJ SOLN: 30.0000 mg | Freq: Four times a day (QID) | INTRAMUSCULAR | Status: AC

## 2016-04-23 MED ORDER — ONDANSETRON HCL 4 MG/2ML IJ SOLN
INTRAMUSCULAR | Status: DC | PRN
Start: 2016-04-23 — End: 2016-04-23
  Administered 2016-04-23: 4 mg via INTRAVENOUS

## 2016-04-23 MED ORDER — NALOXONE HCL 2 MG/2ML IJ SOSY: 1.0000 ug/kg/h | PREFILLED_SYRINGE | INTRAVENOUS | Status: DC | PRN

## 2016-04-23 MED ORDER — ACETAMINOPHEN 325 MG PO TABS
ORAL_TABLET | ORAL | Status: AC
  Filled 2016-04-23: qty 2

## 2016-04-23 MED ORDER — SIMETHICONE 80 MG PO CHEW
80.0000 mg | CHEWABLE_TABLET | ORAL | Status: DC | PRN
  Administered 2016-04-24: 80 mg via ORAL
  Filled 2016-04-23: qty 1

## 2016-04-23 MED ORDER — NALBUPHINE HCL 10 MG/ML IJ SOLN: 5.0000 mg | INTRAMUSCULAR | Status: DC | PRN

## 2016-04-23 MED ORDER — COCONUT OIL OIL: 1.0000 "application " | TOPICAL_OIL | Status: DC | PRN

## 2016-04-23 MED ORDER — SODIUM CHLORIDE 0.9% FLUSH: 3.0000 mL | INTRAVENOUS | Status: DC | PRN

## 2016-04-23 MED ORDER — DIPHENHYDRAMINE HCL 50 MG/ML IJ SOLN: 12.5000 mg | INTRAMUSCULAR | Status: DC | PRN

## 2016-04-23 MED ORDER — NALOXONE HCL 0.4 MG/ML IJ SOLN: 0.4000 mg | INTRAMUSCULAR | Status: DC | PRN

## 2016-04-23 MED ORDER — WITCH HAZEL-GLYCERIN EX PADS: 1.0000 "application " | MEDICATED_PAD | CUTANEOUS | Status: DC | PRN

## 2016-04-23 MED ORDER — SIMETHICONE 80 MG PO CHEW: 80.0000 mg | CHEWABLE_TABLET | ORAL | Status: DC

## 2016-04-23 MED ORDER — KETOROLAC TROMETHAMINE 30 MG/ML IJ SOLN
30.0000 mg | Freq: Four times a day (QID) | INTRAMUSCULAR | Status: AC
  Administered 2016-04-23 (×3): 30 mg via INTRAVENOUS
  Filled 2016-04-23 (×3): qty 1

## 2016-04-23 MED ORDER — SENNOSIDES-DOCUSATE SODIUM 8.6-50 MG PO TABS
2.0000 | ORAL_TABLET | ORAL | Status: DC
  Filled 2016-04-23: qty 2

## 2016-04-23 MED ORDER — IBUPROFEN 600 MG PO TABS
600.0000 mg | ORAL_TABLET | Freq: Four times a day (QID) | ORAL | Status: DC
  Administered 2016-04-24: 600 mg via ORAL

## 2016-04-23 MED ORDER — OXYTOCIN 40 UNITS IN LACTATED RINGERS INFUSION - SIMPLE MED
INTRAVENOUS | Status: DC | PRN
  Administered 2016-04-23: 1000 mL via INTRAVENOUS
  Administered 2016-04-23: 100 mL via INTRAVENOUS

## 2016-04-23 MED ORDER — KETOROLAC TROMETHAMINE 30 MG/ML IJ SOLN: 30.0000 mg | Freq: Four times a day (QID) | INTRAMUSCULAR | Status: DC

## 2016-04-23 NOTE — Progress Notes (Signed)
POSTOPERATIVE DAY # 0 S/P Primary LTCS for failed induction for A2GDM and uncontrolled chronic hypertension, ventriculomegaly   S:         Reports feeling good, but tired             Tolerating po intake / no nausea / no vomiting / no flatus / no BM             Bleeding is light             Pain controlled with On-Q pump, Toradol, Tylenol, Oxycodone             Bedrest, foley catheter in place  Newborn in NICU monitoring CBGs   O:  VS: BP (!) 147/94 (BP Location: Left Arm)   Pulse 87   Temp 98.1 F (36.7 C) (Oral)   Resp 18   Ht 5\' 6"  (1.676 m)   Wt 95.3 kg (210 lb)   LMP 08/03/2015   SpO2 97%   Breastfeeding? Unknown   BMI 33.89 kg/m    LABS:               Recent Labs  04/20/16 1309 04/23/16 0002  WBC 13.6* 18.2*  HGB 13.9 13.0  PLT 201 178               Bloodtype: --/--/O NEG (09/18 2121)  Rubella:     Immune                  Baby Blood Type: O Neg                         I&O: Intake/Output      09/21 0701 - 09/22 0700 09/22 0701 - 09/23 0700   P.O. 30 240   I.V. (mL/kg) 780 (8.2)    Total Intake(mL/kg) 810 (8.5) 240 (2.5)   Urine (mL/kg/hr) 1480 (0.6) 600 (1.2)   Blood 750 (0.3)    Total Output 2230 600   Net -1420 -360                     Physical Exam:             Alert and Oriented X3  Lungs: Clear and unlabored  Heart: regular rate and rhythm / no mumurs  Abdomen: soft, non-tender, non-distended, bowel sounds hypoactive             Fundus: firm, non-tender, U-E             Dressing: none              Incision:  approximated with sutures and Dermabond / no erythema / no ecchymosis / no drainage  Perineum: intact  Lochia: scant  Extremities: +2 edema BLE hands and feet, no calf pain or tenderness, SCDs in place  A:        POD # 1 S/P Primary LTCS for failed induction for A2GDM and uncontrolled chronic hypertension, ventriculomegaly  A2 GDM - stable, delivered   CHTN - 1 elevated BP, but resolved after pain relief   O Neg - baby O Neg, no Rhogam  indicated  P:        Routine postoperative care              Continue to monitor BPs closely and notify for SBP >160 DBP >110  Continue routine management  Carlean JewsMeredith Sigmon, CNM

## 2016-04-23 NOTE — Discharge Summary (Signed)
Obstetrical Discharge Summary  Patient Name: Meghan HatcherDana Poon DOB: June 12, 1981 MRN: [id number]  Date of Admission: 04/19/2016 Date of Discharge: 04/25/2016  [facility]  Gestational Age at Delivery: 8423w0d   Antepartum complications: Late to care, Obesity, poorly controlled hypertension, GDMA2, fetal ventriculomegaly  Admitting Diagnosis:  Induction of labor for high risk pregnancy Secondary Diagnosis: Patient Active Problem List   Diagnosis Date Noted  . Hypertension affecting pregnancy in third trimester 04/19/2016  . Pregnancy complicated by fetal cerebral ventriculomegaly 04/12/2016  . Chronic hypertension in pregnancy 03/22/2016  . Pregnancy complicated by fetal cerebral ventriculomegaly, single gestation (HCC) 03/22/2016  . Gestational diabetes 03/22/2016  . Hypertension in pregnancy, antepartum 03/22/2016    Augmentation: AROM, Pitocin, Cytotec, Foley Balloon and cervidil Complications: None Intrapartum complications/course: She was given cervidil, then pitocin with foley bulb, then cytotec, then AROM, then pitocin with IUPC and adequate MVUs but minimal cervical change over the course of 4 days.  She was counseled on options and elected to proceed with primary cesarean delivery. Date of Delivery: 04/23/2016 Delivered By: Leeroy Bockhelsea Ward Delivery Type: primary cesarean section, low transverse incision with double layer closure Anesthesia: epidural Placenta: sponatneous Laceration: n/a Episiotomy: none Newborn Data: Live born female  Birth Weight: 5 lb 11.4 oz (2590 g) APGAR: 9, 9    Discharge Physical Exam:  BP (!) 159/98 (BP Location: Left Arm)   Pulse (!) 102   Temp 97.4 F (36.3 C) (Oral)   Resp 17   Ht 5\' 6"  (1.676 m)   Wt 95.3 kg (210 lb)   LMP 08/03/2015   SpO2 97%   Breastfeeding? Unknown   BMI 33.89 kg/m   General: NAD CV: RRR Pulm: CTABL, nl effort ABD: s/nd/nt, fundus firm and below the umbilicus Lochia:  moderate Incision: c/d/i, on-q pump catheters in place DVT Evaluation: LE non-ttp, no evidence of DVT on exam.  Hemoglobin  Date Value Ref Range Status  04/23/2016 13.0 12.0 - 16.0 g/dL Final   HCT  Date Value Ref Range Status  04/23/2016 37.7 35.0 - 47.0 % Final    Post partum course: Her postpartum course was uncomplicated.  She has had some mildly elevated blood pressures, but not in the severe range.  She will f/u with Dr. Elesa MassedWard for repeat BP and incision check in 1-2 weeks.  Her blood sugars have been stable.    Postpartum Procedures: none Disposition: stable, discharge to home. Baby Feeding: formula Baby Disposition: home with mom  Rh Immune globulin given: not needed, baby is O neg Rubella vaccine given: no Tdap vaccine DECLINED Flu vaccine DECLINED  Contraception: TBD  Prenatal Labs:  ABO, Rh:  O neg Antibody:  neg Rubella:  imm RPR:   neg HBsAg:  neg  HIV:   neg GBS:   +   Plan:  Meghan HatcherDana Perales was discharged to home in good condition. [facility] Dr Elesa MassedWard in 1-2 weeks for incision check   Discharge Medications:   Medication List    STOP taking these medications   glyBURIDE 2.5 MG tablet Commonly known as:  DIABETA   labetalol 100 MG tablet Commonly known as:  NORMODYNE   NIFEdipine 30 MG 24 hr tablet Commonly known as:  PROCARDIA-XL/ADALAT-CC/NIFEDICAL-XL     TAKE these medications   aspirin 81 MG chewable tablet Chew 1 tablet (81 mg total) by mouth daily.   ibuprofen 600 MG tablet Commonly known as:  ADVIL,MOTRIN Take 1 tablet (600 mg total) by mouth every  6 (six) hours.   oxyCODONE 5 MG immediate release tablet Commonly known as:  Oxy IR/ROXICODONE Take 1 tablet (5 mg total) by mouth every 4 (four) hours as needed (pain scale 4-7).        Follow-up Information    Elenora Fender Ward, MD. Schedule an appointment as soon as possible for a visit in 1 week(s).   Specialty:  Obstetrics and Gynecology Why:  Post-op  appointment in 1-2 weeks with BP check, then postpartum visit 4 weeks after.  She will need a 2 hour GTT at her 6 week PP visit.  Contact information: [address] [place] [address] [phone]           Signed: Carlean Jews, CNM

## 2016-04-23 NOTE — Anesthesia Procedure Notes (Signed)
Spinal  Patient location during procedure: OB Start time: 04/23/2016 12:05 AM End time: 04/23/2016 12:19 AM Staffing Anesthesiologist: Emmie Niemann Performed: anesthesiologist  Preanesthetic Checklist Completed: patient identified, site marked, surgical consent, pre-op evaluation, timeout performed, IV checked, risks and benefits discussed and monitors and equipment checked Spinal Block Patient position: sitting Prep: ChloraPrep Patient monitoring: heart rate, continuous pulse ox, blood pressure and cardiac monitor Approach: midline Location: L4-5 Injection technique: single-shot Needle Needle type: Whitacre and Introducer  Needle gauge: 24 G Needle length: 9 cm Assessment Sensory level: T2 Additional Notes Negative paresthesia. Negative blood return. Positive free-flowing CSF. Expiration date of kit checked and confirmed. Patient tolerated procedure well, without complications.

## 2016-04-23 NOTE — Lactation Note (Signed)
Lactation Consultation Note  Patient Name: Meghan HatcherDana Lowe JYNWG'NToday's Date: 04/23/2016     Maternal Data  Pt states she does not want to pump her breasts and would like to formula feed only.  Feeding    LATCH Score/Interventions                      Lactation Tools Discussed/Used     Consult Status      [name] 04/23/2016, 6:13 PM

## 2016-04-23 NOTE — Discharge Instructions (Signed)
Call your doctor for increased pain or vaginal bleeding, temperature above 100.4, depression, or concerns.  No strenuous activity or heavy lifting for 6 weeks.  No intercourse, tampons, douching, or enemas for 6 weeks.  No tub baths-showers only.  No driving for 2 weeks or while taking pain medications.  Continue prenatal vitamin and iron.  Keep incision clean and dry.  Call your doctor for incision concerns including redness, swelling, bleeding or drainage, or if begins to come apart.  Increase calories and fluids while breastfeeding. °

## 2016-04-23 NOTE — Transfer of Care (Signed)
Immediate Anesthesia Transfer of Care Note  Patient: Meghan Lowe  Procedure(s) Performed: Procedure(s): CESAREAN SECTION (N/A)  Patient Location: PACU  Anesthesia Type:Spinal  Level of Consciousness: awake, alert  and oriented  Airway & Oxygen Therapy: Patient Spontanous Breathing  Post-op Assessment: Report given to RN and Post -op Vital signs reviewed and stable  Post vital signs: Reviewed and stable  Last Vitals:  Vitals:   04/22/16 2207 04/22/16 2307  BP: 137/83 139/82  Pulse: 82 86  Resp:  18  Temp:  37 C    Last Pain:  Vitals:   04/22/16 2307  TempSrc: Oral  PainSc:          Complications: No apparent anesthesia complications

## 2016-04-23 NOTE — Op Note (Signed)
Cesarean Section Procedure Note  04/19/2016 - 04/22/2016 Patient's last menstrual period was 08/03/2015.   Patient:  Meghan Lowe  35 y.o. female at [redacted]w[redacted]d Preoperative diagnosis:   1. Failed induction of labor 2. Gestational diabetes - A2 3. Poorly controlled chronic hypertension 4. Fetal Ventriculomeagly  Postoperative diagnosis:  same as preop, live female infant  PROCEDURE:  Procedure(s): CESAREAN SECTION (N/A) Surgeon:  Surgeon(s) and Role:    * Chelsea C Ward, MD - Primary       Milon Score, CNM - Assist Anesthesia:  spinal I/O: IN: 1600 crystalloid, OUT: EBL 750, UOP 400 Specimens:  Cord Blood, placenta Complications: None Apparent Disposition:  VS stable to PACU  Findings: normal uterus, tubes and ovaries bilaterally,  Live born female, Birth Weight:  5-11,m 2590g APGAR: 9, 9   Indication for procedure: 35 y.o. female at 106w0d who was induced starting 9/18 for the above mentioned diagnoses.  She was given cervidil, then pitocin with foley bulb, then cytotec, then AROM, then pitocin with IUPC and adequate MVUs but minimal cervical change over the course of 4 days.  She was counseled on options and elected to proceed with primary cesarean delivery.  Procedure Details   The risks, benefits, complications, treatment options, and expected outcomes were discussed with the patient. Informed consent was obtained. The patient was taken to Operating Room, identified as Meghan Lowe and the procedure verified as a cesarean delivery.   After administration of anesthesia, the patient was prepped and draped in the usual sterile manner, including a vaginal prep. A surgical time out was performed, with the pediatric team present. After confirming adequate anesthesia, a Pfannenstiel incision was made and carried down through the subcutaneous tissue to the fascia. Fascial incision was made and extended transversely. The fascia was separated from the underlying rectus tissue superiorly and inferiorly. The  peritoneum was identified and entered. Peritoneal incision was extended longitudinally.  A low transverse uterine incision was made. Delivered from cephalic presentation was a live born female . Delayed cord clamping was performed for 60 seconds, during which we sang happy birthday to baby Meghan Lowe. The umbilical cord was doubly clamped and cut, and the baby was handed off to the awaitng pediatrician.  Cord blood was obtained for evaluation due to Rh negative status. The placenta was removed intact and appeared normal. The uterus was delivered from the abdominal cavity and cleared of clots, membranes, and debris. The uterus, tubes and ovaries appeared normal. The uterine incision was closed with running locking sutures of 0 Vicryl, and then a second, imbricating stitch was placed. Hemostasis was observed. The abdominal cavity was evacuated of extraneous fluid. The uterus was returned to the abdominal cavity and again the incision was inspected for hemostasis, which was confirmed.  The paracolic gutters were cleaned. The on-q trochars were pierced through the skin and fascia.  The catheters were threaded through and tucked beneath the fascia.  The fascia was then reapproximated with running suture of vicryl. After a change of gloves, the subcutaneous tissue was irrigated and reapproximated with 3-0 vicryl. The skin was closed with 4-0 Monocryl and covered with surgical glue. The on-q pump was primed with 0.5% bupivacaine and a sterile dressing was applied.     Instrument, sponge, and needle counts were correct prior the abdominal closure and at the conclusion of the case.   I was present and performed this procedure in its entirety.  ----- Ranae Plumber, MD Attending Obstetrician and Gynecologist [facility], Department of OB/GYN [facility]

## 2016-04-24 ENCOUNTER — Encounter: Payer: Self-pay | Admitting: Obstetrics & Gynecology

## 2016-04-24 LAB — CBC
HCT: 31.1 % — ABNORMAL LOW (ref 35.0–47.0)
Hemoglobin: 10.5 g/dL — ABNORMAL LOW (ref 12.0–16.0)
MCH: 29.4 pg (ref 26.0–34.0)
MCHC: 33.7 g/dL (ref 32.0–36.0)
MCV: 87.2 fL (ref 80.0–100.0)
PLATELETS: 166 10*3/uL (ref 150–440)
RBC: 3.57 MIL/uL — ABNORMAL LOW (ref 3.80–5.20)
RDW: 14 % (ref 11.5–14.5)
WBC: 13 10*3/uL — ABNORMAL HIGH (ref 3.6–11.0)

## 2016-04-24 MED ORDER — OXYCODONE HCL 5 MG PO TABS
5.0000 mg | ORAL_TABLET | ORAL | Status: DC | PRN
  Administered 2016-04-24 – 2016-04-25 (×2): 5 mg via ORAL
  Filled 2016-04-24 (×4): qty 1

## 2016-04-24 MED ORDER — SODIUM CHLORIDE 0.9% FLUSH: 3.0000 mL | Freq: Two times a day (BID) | INTRAVENOUS | Status: DC

## 2016-04-24 MED ORDER — SODIUM CHLORIDE 0.9% FLUSH: 3.0000 mL | INTRAVENOUS | Status: DC | PRN

## 2016-04-24 MED ORDER — IBUPROFEN 600 MG PO TABS
600.0000 mg | ORAL_TABLET | Freq: Four times a day (QID) | ORAL | Status: DC
  Administered 2016-04-24 – 2016-04-25 (×5): 600 mg via ORAL
  Filled 2016-04-24 (×2): qty 1

## 2016-04-24 MED ORDER — OXYTOCIN 40 UNITS IN LACTATED RINGERS INFUSION - SIMPLE MED
2.5000 [IU]/h | INTRAVENOUS | Status: AC
  Filled 2016-04-24: qty 1000

## 2016-04-24 MED ORDER — LACTATED RINGERS IV SOLN: INTRAVENOUS | Status: DC

## 2016-04-24 MED ORDER — SODIUM CHLORIDE 0.9 % IV SOLN: 250.0000 mL | INTRAVENOUS | Status: DC

## 2016-04-24 MED ORDER — BUPIVACAINE ON-Q PAIN PUMP (FOR ORDER SET NO CHG)
INJECTION | Status: DC
  Filled 2016-04-24: qty 1

## 2016-04-24 MED ORDER — ACETAMINOPHEN 500 MG PO TABS
1000.0000 mg | ORAL_TABLET | Freq: Four times a day (QID) | ORAL | Status: DC
  Administered 2016-04-24 – 2016-04-25 (×5): 1000 mg via ORAL
  Filled 2016-04-24 (×5): qty 2

## 2016-04-24 MED ORDER — SIMETHICONE 80 MG PO CHEW: 160.0000 mg | CHEWABLE_TABLET | Freq: Four times a day (QID) | ORAL | Status: DC | PRN

## 2016-04-24 NOTE — Clinical Social Work Maternal (Signed)
CLINICAL SOCIAL WORK MATERNAL/CHILD NOTE  Patient Details  Name: Meghan Lowe MRN: [id number] Date of Birth: 12/14/1980  Date:  04/24/2016  Clinical Social Worker Initiating Note:  Naomy Esham Martha Jaeleigh Monaco, MSW, LCSW-A     Date/ Time Initiated:  04/24/16/1425           Child's Name:  Meghan Lowe   Legal Guardian:  Mother   Need for Interpreter:  None   Date of Referral:  04/24/16     Reason for Referral:  Other (Comment) (SCN)   Referral Source:  NICU   Address:  [address], [place], [place], [address]  Phone number:  [phone]   Household Members: Spouse   Natural Supports (not living in the home): Parent, Community, Extended Family, Friends, Immediate Family, Neighbors   Professional Supports:Case Manager/Social Worker   Employment:Unemployed   Type of Work:  (Husband works)   Education:  High school graduate   Financial Resources:Medicaid   Other Resources: WIC   Cultural/Religious Considerations Which May Impact Care: None noted  Strengths: Ability to meet basic needs , Compliance with medical plan , Home prepared for child , Understanding of illness (Multiple social supports)   Risk Factors/Current Problems: None   Cognitive State: Alert , Able to Concentrate    Mood/Affect: Calm , Happy    CSW Assessment:CSW visited the mother, father and baby at bedside. Father was visibly excited, and the mother was bottle-feeding the baby. CSW introduced self and role in care.   Meghan Lowe is planning on staying at home with the baby while her husband works. The home is ready including a crib and car seat, as well as clothing, diapers, and first aid necessities. The parents have already applied for WIC, and as the mother has chosen to bottle feed, only, she is aware of the costs for formula and how WIC can help.  CSW provided education about possible emotional support needs postpartum, including postpartum depression and anxiety, as  well as anxiety related to having a child in NICU. The parents were able to verbalize warning signs of PPD and PPA, as well as verbalize information from their medical team concerning possible adverse issues with the baby.   Both mother and father were bonding with the baby during the interview, and both were appropriately excited about their child.  CSW Plan/Description: No Further Intervention Required/No Barriers to Discharge    Meghan Mosco M Lejon Afzal, LCSW 04/24/2016, 2:28 PM    CLINICAL SOCIAL WORK MATERNAL/CHILD NOTE  Patient Details  Name: Meghan Lowe MRN: [id number] Date of Birth: Jun 10, 1981  Date:  04/24/2016  Clinical Social Worker Initiating Note:  Argentina Ponder, MSW, LCSW-A Date/ Time Initiated:  04/24/16/1425     Child's Name:  Meghan Lowe   Legal Guardian:  Mother   Need for Interpreter:  None   Date of Referral:  04/24/16     Reason for Referral:  Other (Comment) (SCN)   Referral Source:  NICU   Address:  [address], [place], [place], [address]  Phone number:  [phone]   Household Members:  Spouse   Natural Supports (not living in the home):  Parent, Garment/textile technologist, Extended Family, Friends, Immediate Family, Chief of Staff Supports: Case Research officer, political party   Employment: Unemployed   Type of Work:  (Husband works)   Education:  Associate Professor Resources:  Medicaid   Other Resources:  [facility]   Cultural/Religious Considerations Which May Impact Care:  None noted  Strengths:  Ability to meet basic needs , Compliance with medical plan , Home prepared for child , Understanding of illness (Multiple social supports)   Risk Factors/Current Problems:  None   Cognitive State:  Alert , Able to Concentrate    Mood/Affect:  Calm , Happy    CSW Assessment: CSW visited the mother, father and baby at bedside. Father was visibly excited, and the mother was bottle-feeding the baby. CSW introduced self and role in care.   Meghan Lowe is planning on staying at home with the baby while her husband works. The home is ready including a crib and car seat, as well as clothing, diapers, and first aid necessities. The parents have already applied for [facility], and as the mother has chosen to bottle feed, only, she is aware of the costs for formula and how WIC can help.  CSW provided education about possible emotional support needs postpartum, including postpartum depression and anxiety, as well as anxiety related to having a  child in NICU. The parents were able to verbalize warning signs of PPD and PPA, as well as verbalize information from their medical team concerning possible adverse issues with the baby.   Both mother and father were bonding with the baby during the interview, and both were appropriately excited about their child.  CSW Plan/Description:  No Further Intervention Required/No Barriers to Discharge    Judi Cong, LCSW 04/24/2016, 2:28 PM

## 2016-04-24 NOTE — Anesthesia Postprocedure Evaluation (Signed)
Anesthesia Post Note  Patient: Costella HatcherDana Enslin  Procedure(s) Performed: Procedure(s) (LRB): CESAREAN SECTION (N/A)  Patient location during evaluation: Mother Baby Anesthesia Type: Spinal Level of consciousness: oriented and awake and alert Pain management: pain level controlled Vital Signs Assessment: post-procedure vital signs reviewed and stable Respiratory status: spontaneous breathing, respiratory function stable and patient connected to nasal cannula oxygen Cardiovascular status: blood pressure returned to baseline and stable Postop Assessment: no headache and no backache Anesthetic complications: no    Last Vitals:  Vitals:   04/24/16 0427 04/24/16 0842  BP: 137/84 (!) 147/99  Pulse: (!) 105 100  Resp: 18   Temp: 36.8 C 36.8 C    Last Pain:  Vitals:   04/24/16 0842  TempSrc: Oral  PainSc:                  Lenard SimmerAndrew Tre Sanker

## 2016-04-24 NOTE — Progress Notes (Addendum)
POSTOPERATIVE DAY # 1 S/P  Primary LTCS for failed induction for A2GDM and uncontrolled hypertension, ventriculomegaly   S:         Reports feeling better today, but did not sleep last night              Tolerating po intake / no nausea / no vomiting / + flatus / + BM             Bleeding is light             Pain controlled with Motrin, Tylenol, and Roxicodone, and On-Q pump             Up ad lib / ambulatory/ voiding QS  Newborn formula feeding - his blood sugars are stable now and has been out of the NICU since 11am    O:  VS: BP (!) 147/99   Pulse 100   Temp 98.2 F (36.8 C) (Oral)   Resp 18   Ht 5\' 6"  (1.676 m)   Wt 95.3 kg (210 lb)   LMP 08/03/2015   SpO2 100%   Breastfeeding? Unknown   BMI 33.89 kg/m    LABS:               Recent Labs  04/23/16 0002 04/24/16 0637  WBC 18.2* 13.0*  HGB 13.0 10.5*  PLT 178 166               Bloodtype: --/--/O NEG (09/18 2121)  Rubella:   Immune                    Baby Blood Type: O Negative                          I&O: Intake/Output      09/22 0701 - 09/23 0700 09/23 0701 - 09/24 0700   P.O. 240    I.V. (mL/kg) 625 (6.6)    Total Intake(mL/kg) 865 (9.1)    Urine (mL/kg/hr) 1450 (0.6)    Blood     Total Output 1450     Net -585                       Physical Exam:             Alert and Oriented X3  Lungs: Clear and unlabored  Heart: regular rate and rhythm / no mumurs  Abdomen: soft, non-tender, non-distended, On-Q pump intact, dressing C/D/I no significant erythema or ecchymosis              Fundus: firm, non-tender, U-1             Dressing: no dressing               Incision:  approximated with sutures / no erythema / no ecchymosis / no drainage  Perineum: intact  Lochia: scant  Extremities: mild non-pitting LE edema, no calf pain or tenderness A:        POD # 1 S/P  Primary LTCS for failed induction for A2GDM and uncontrolled chronic hypertension, ventriculomegaly            A2 GDM, stable, delivered   ABL  Anemia   CHTN vs. gHTN - BPs 147/99-146/95   P:        Routine postoperative care              Pt. States her blood pressures are elevated because  she has pain - will continue to monitor BP before starting medication   Continue current care  May shower today   D/C IV   Carlean JewsMeredith Sigmon, CNM

## 2016-04-24 NOTE — Anesthesia Post-op Follow-up Note (Signed)
  Anesthesia Pain Follow-up Note  Patient: Meghan HatcherDana Bucaro  Day #: 1  Date of Follow-up: 04/24/2016 Time: 9:04 AM  Last Vitals:  Vitals:   04/24/16 0427 04/24/16 0842  BP: 137/84 (!) 147/99  Pulse: (!) 105 100  Resp: 18   Temp: 36.8 C 36.8 C    Level of Consciousness: alert  Pain: none   Side Effects:Pruritis  Catheter Site Exam:clean, dry, no drainage     Plan: D/C from anesthesia care  Lenard SimmerAndrew Rin Gorton

## 2016-04-25 LAB — CBC
HCT: 33 % — ABNORMAL LOW (ref 35.0–47.0)
Hemoglobin: 11.2 g/dL — ABNORMAL LOW (ref 12.0–16.0)
MCH: 29.7 pg (ref 26.0–34.0)
MCHC: 33.9 g/dL (ref 32.0–36.0)
MCV: 87.7 fL (ref 80.0–100.0)
PLATELETS: 246 10*3/uL (ref 150–440)
RBC: 3.76 MIL/uL — AB (ref 3.80–5.20)
RDW: 13.9 % (ref 11.5–14.5)
WBC: 11.8 10*3/uL — AB (ref 3.6–11.0)

## 2016-04-25 MED ORDER — IBUPROFEN 600 MG PO TABS: 600.0000 mg | ORAL_TABLET | Freq: Four times a day (QID) | ORAL | 0 refills | Status: DC

## 2016-04-25 MED ORDER — OXYCODONE HCL 5 MG PO TABS: 5.0000 mg | ORAL_TABLET | ORAL | 0 refills | Status: DC | PRN

## 2016-04-25 NOTE — Progress Notes (Signed)
pt discharged home with infant.  Discharge instructions, prescriptions and follow up appointment given to and reviewed with pt.  Pt verbalized understanding, all questions answered.  Escorted by auxiliary. 

## 2016-04-25 NOTE — Progress Notes (Addendum)
POSTOPERATIVE DAY # 2 S/P Primary LTCS for failed induction for A2GDM and uncontrolled hypertension, ventriculomegaly   S:         Reports feeling better and would like to go home today, but peds is still monitoring baby closely              Tolerating po intake / no nausea / no vomiting / + flatus / + BM             Bleeding is light             Pain controlled withIbuprofen, Tylenol, Roxicodone, and On-Q pump             Up ad lib / ambulatory/ voiding QS  Newborn formula feeding    O:  VS: BP (!) 144/86 (BP Location: Left Arm)   Pulse 94   Temp 97.4 F (36.3 C) (Oral)   Resp 16   Ht 5\' 6"  (1.676 m)   Wt 95.3 kg (210 lb)   LMP 08/03/2015   SpO2 99%   Breastfeeding? Unknown   BMI 33.89 kg/m    LABS:               Recent Labs  04/24/16 0637 04/25/16 0602  WBC 13.0* 11.8*  HGB 10.5* 11.2*  PLT 166 246               Bloodtype: --/--/O NEG (09/18 2121)  Rubella:     Immune  Baby Blood Type: O Negative                                          I&O: Intake/Output      09/23 0701 - 09/24 0700 09/24 0701 - 09/25 0700   P.O.     I.V. (mL/kg) 1483.5 (15.6)    Total Intake(mL/kg) 1483.5 (15.6)    Urine (mL/kg/hr) 1250 (0.5)    Total Output 1250     Net +233.5                       Physical Exam:             Alert and Oriented X3  Lungs: Clear and unlabored  Heart: regular rate and rhythm / no mumurs  Abdomen: soft, non-tender, non-distended, active bowel sounds              Fundus: firm, non-tender, U-2             Dressing: none               Incision:  approximated with sutures / no erythema / no ecchymosis / no drainage  Perineum: intact  Lochia: scant   Extremities: +1 BLE edema, no calf pain or tenderness  A:        POD # 2 S/P Primary LTCS for failed induction for A2GDM and uncontrolled chronic hypertension, ventriculomegaly  A2 GDM, stable, delivered              ABL Anemia              CHTN, BPs 140s/80s  P:        Routine postoperative care     If baby is able to leave this afternoon, we will discharge patient today  Will need 2 hour GTT at 6 week PP visit   Contraception: undecided   Anticipate  discharge home tomorrow in AM  SenecaMeredith Doaa Kendzierski, [place]M

## 2016-04-26 LAB — SURGICAL PATHOLOGY

## 2018-05-10 IMAGING — US US MFM OB DETAIL+14 WK
1 series · 14 of 28 positions shown · non-contrast
Comparison: none

[Series 1: us mfm ob detail+14 wk · 0.25mm/px · 79 acquisitions, 14 frames shown]
[im 3/79]
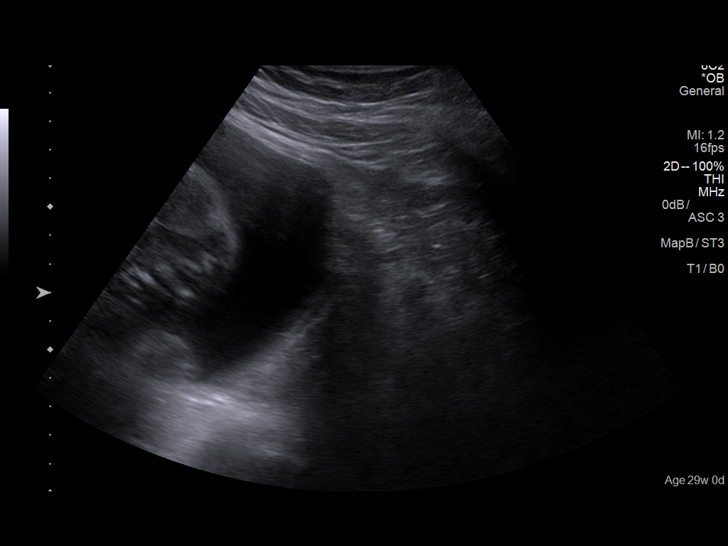
[im 9/79]
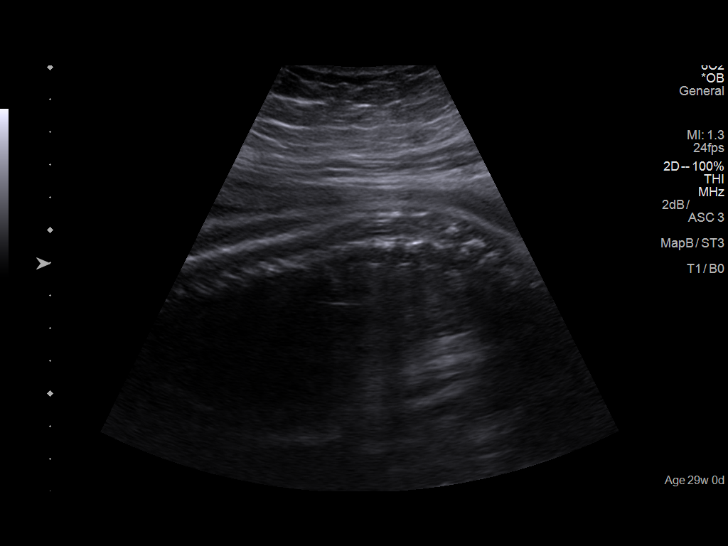
[im 15/79]
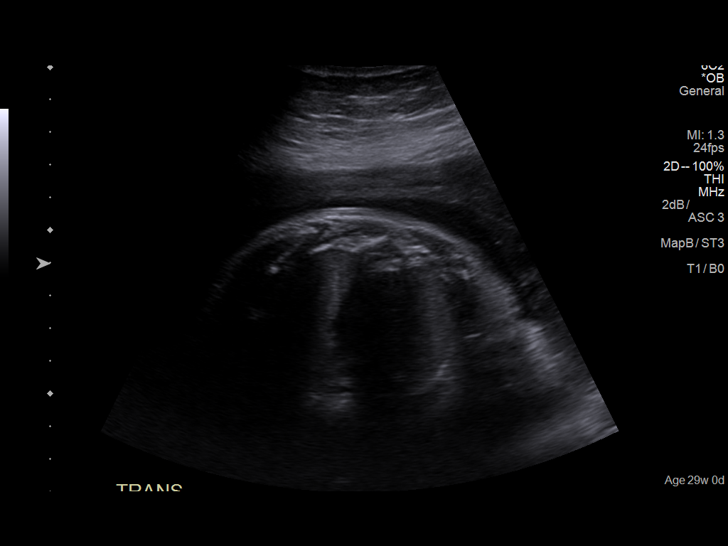
[im 21/79]
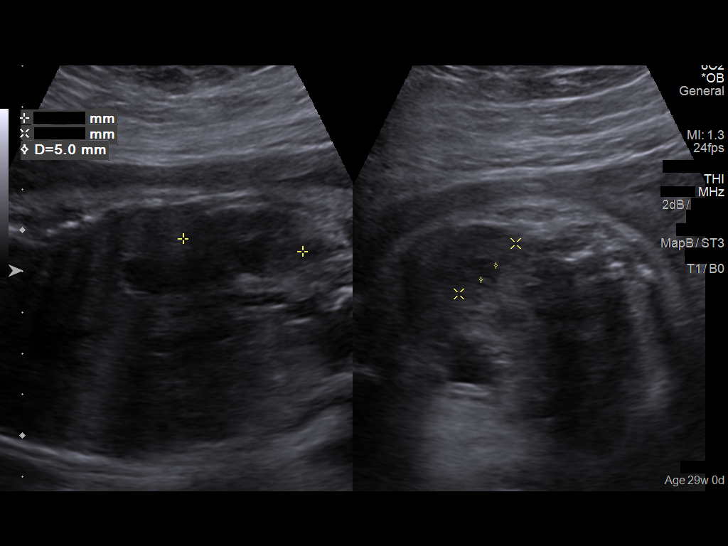
[im 27/79]
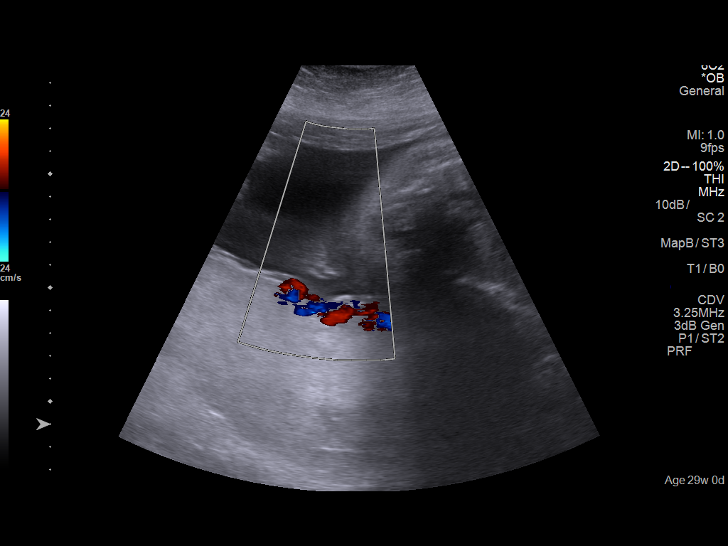
[im 32/79]
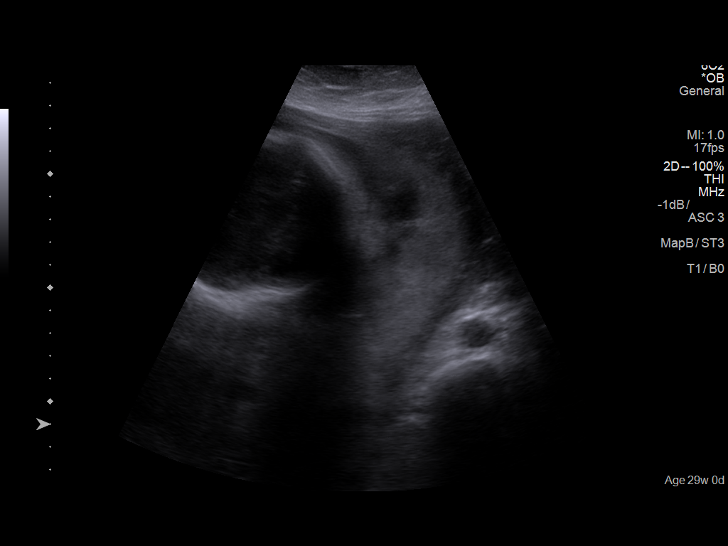
[im 38/79]
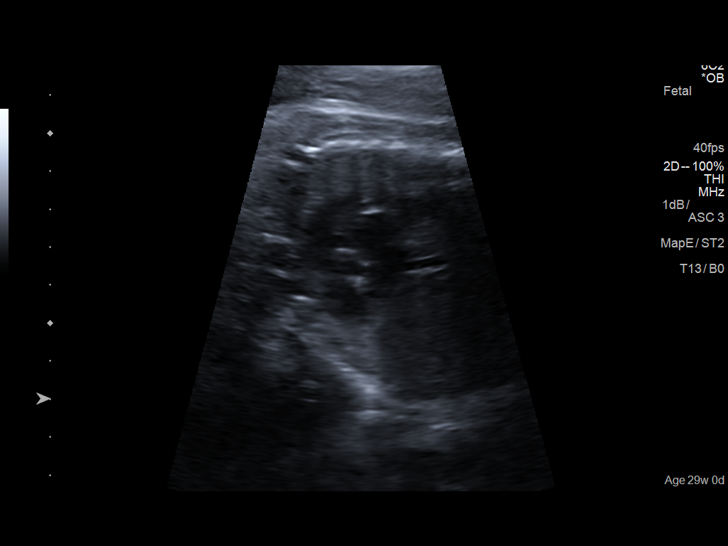
[im 44/79]
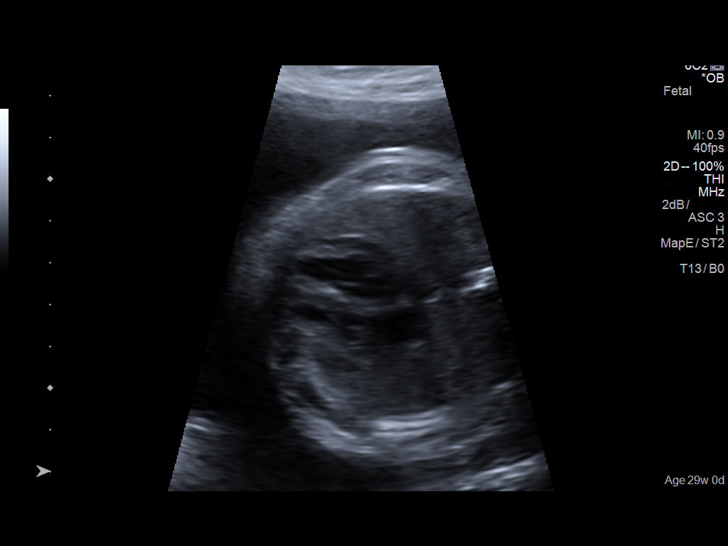
[im 50/79]
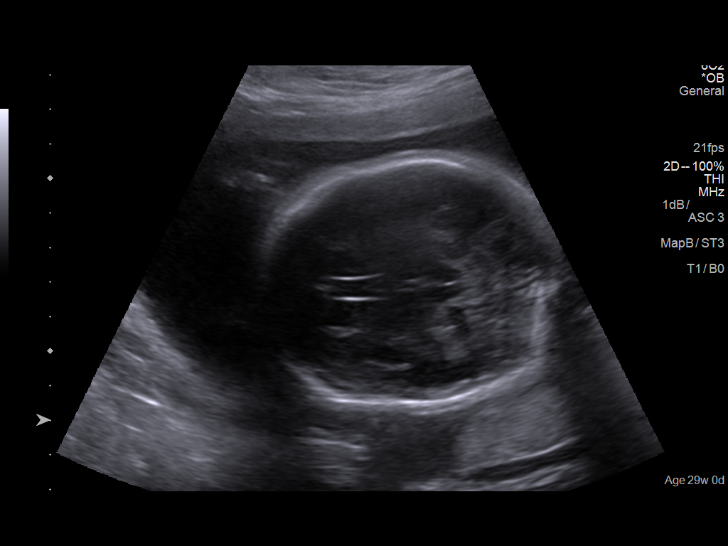
[im 55/79]
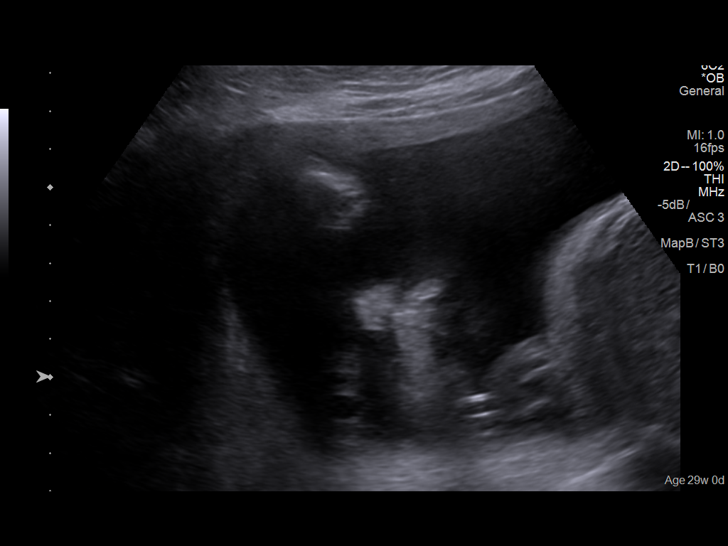
[im 61/79]
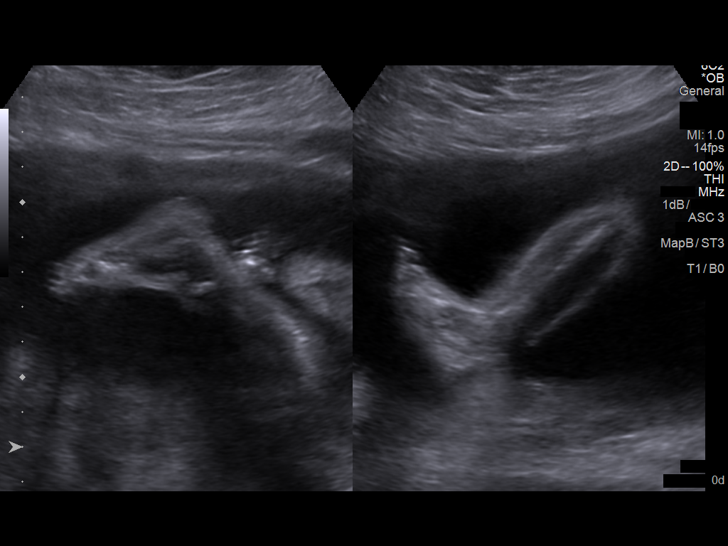
[im 67/79]
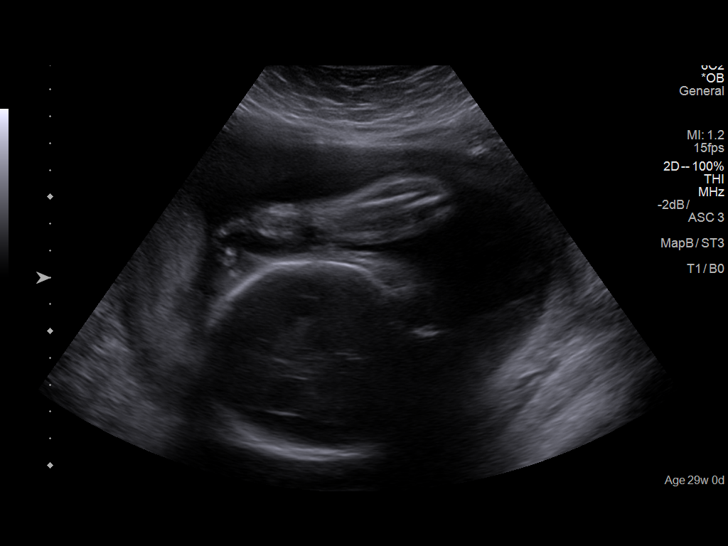
[im 73/79]
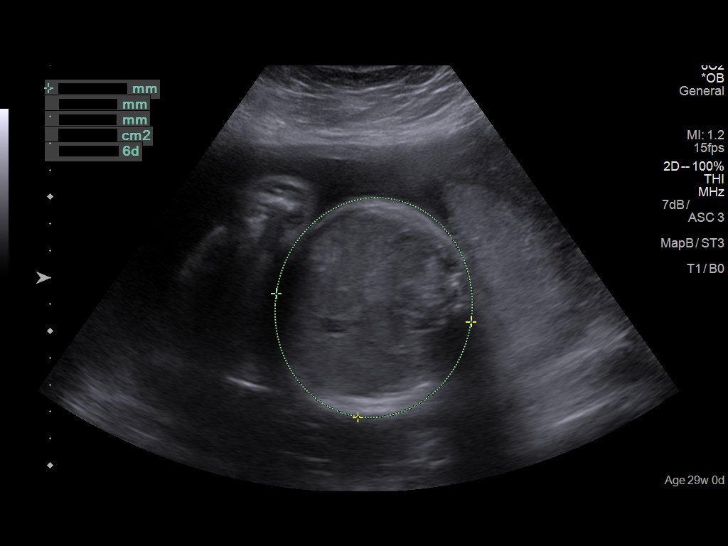
[im 79/79]
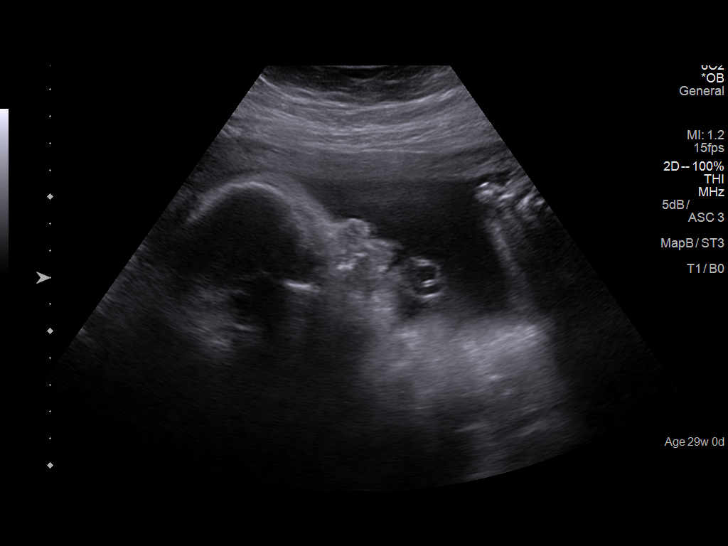

[14 of 28 positions shown; findings below may reference images not displayed]

Canned report from images found in remote index.

Refer to host system for actual result text.

## 2018-07-05 IMAGING — US US MFM OB FOLLOW-UP
1 series · 13 of 28 positions shown · non-contrast
Comparison: none

PATIENT INFO:

PERFORMED BY:
SERVICE(S) PROVIDED:
INDICATIONS:
37 weeks gestation of pregnancy
FETAL EVALUATION:
Num Of Fetuses:     1
BIOPHYSICAL EVALUATION:
Amniotic F.V:   Pocket => 2 cm two         F. Tone:         Observed
planes
F. Movement:    Observed                   Score:           [DATE]
F. Breathing:   Observed
BIOMETRY:
BPD:      87.4  mm     G. Age:  35w 2d         19  %    CI:        71.03   %    70 - 86
FL/HC:       20.2  %    20.8 -
HC:      330.4  mm     G. Age:  37w 4d         39  %    HC/AC:       1.06       0.92 -
AC:      311.8  mm     G. Age:  35w 1d         15  %    FL/BPD:      76.2  %    71 - 87
FL:       66.6  mm     G. Age:  34w 2d        < 3  %    FL/AC:       21.4  %    20 - 24
HUM:      58.6  mm     G. Age:  33w 6d         12  %
Est. FW:    9632   gm   5 lb 12 oz      18  %
GESTATIONAL AGE:
LMP:           37w 0d        Date:  08/04/15                 EDD:   05/10/16
U/S Today:     35w 4d                                        EDD:   05/20/16
Best:          37w 0d     Det. By:  LMP  (08/04/15)          EDD:   05/10/16
ANATOMY:
Ventricles:            Mild dilation, unable  LVOT:                   Visualized
to measure
Lips:                  Visualized             Stomach:                Seen
Heart:                 4-Chamber view         Kidneys:                Within Normal Limits
appears normal
RVOT:                  Visualized             Bladder:                Seen

[Series 1: us mfm ob follow-up · 0.26mm/px · 13 of 48 slices shown]
[im 2/48]
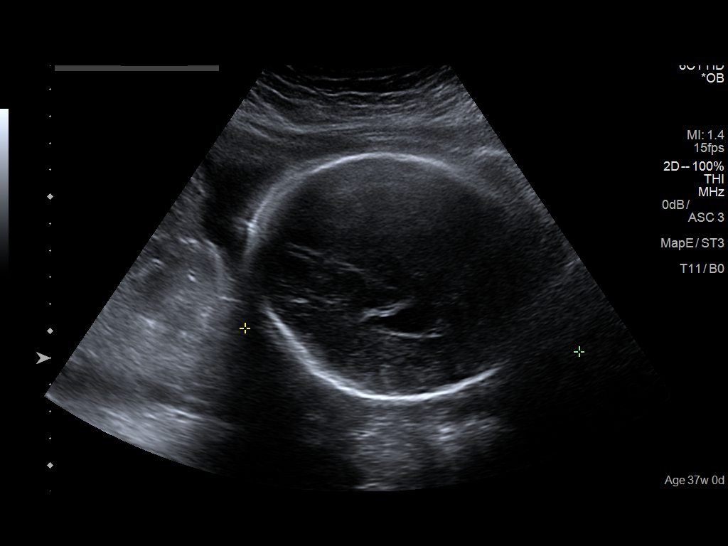
[im 6/48]
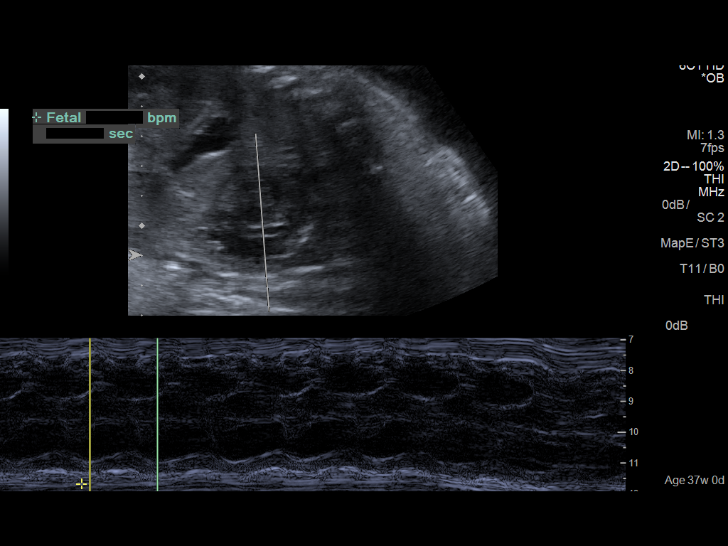
[im 9/48]
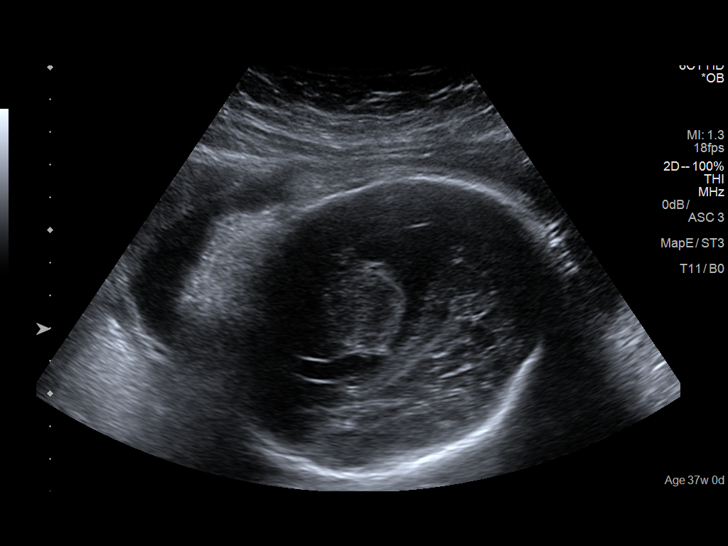
[im 13/48]
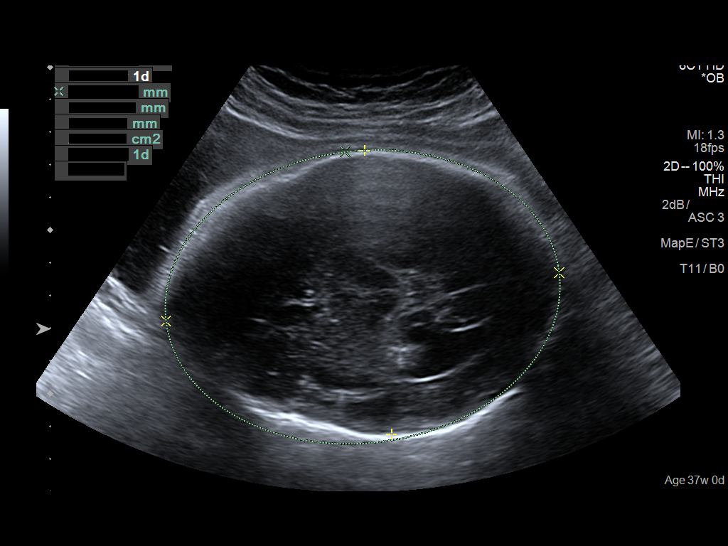
[im 16/48]
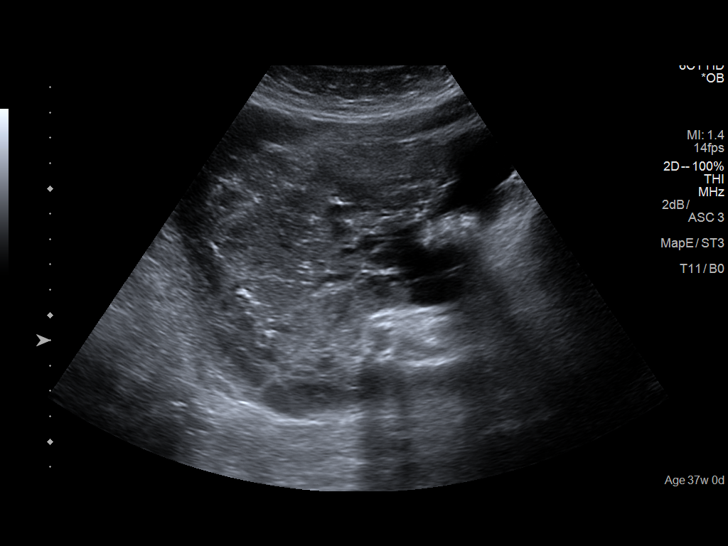
[im 20/48]
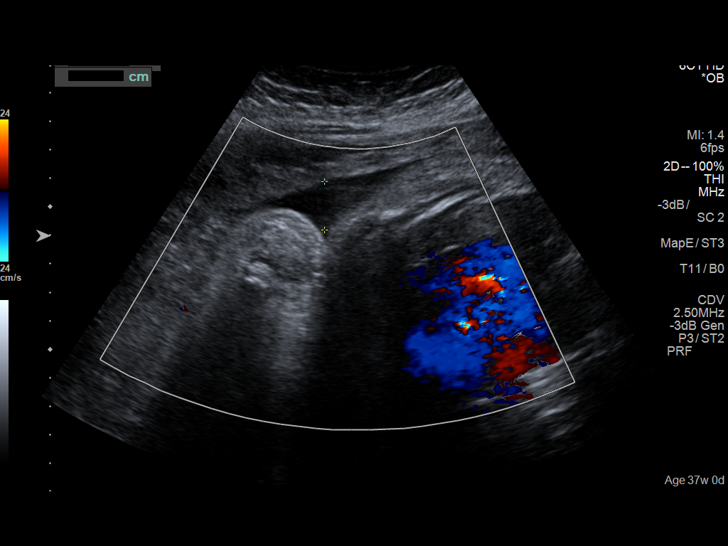
[im 25/48]
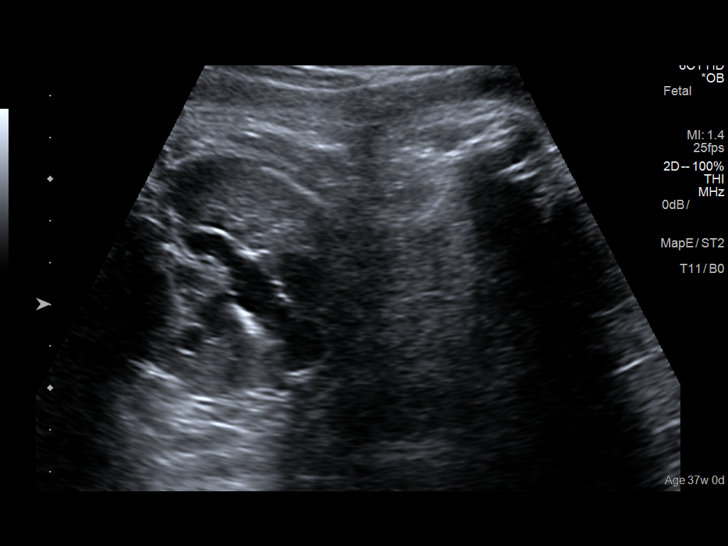
[im 28/48]
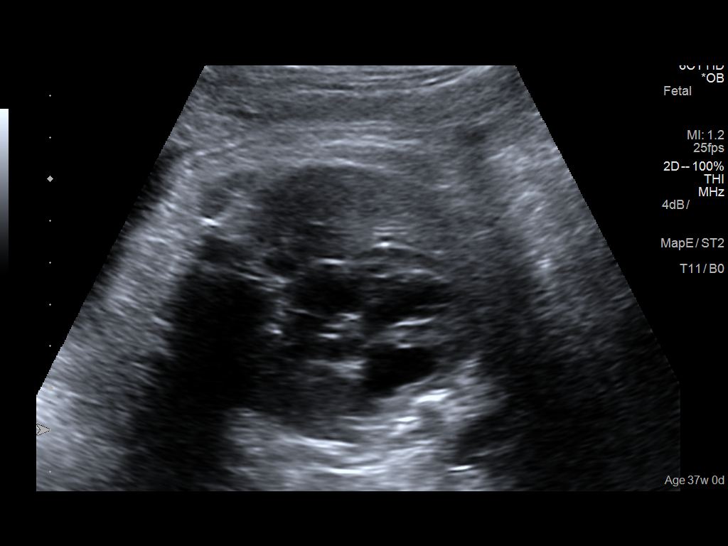
[im 32/48]
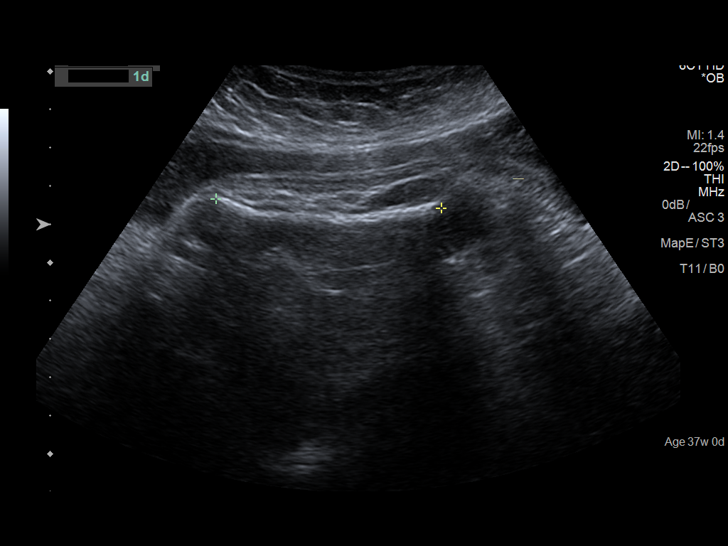
[im 35/48]
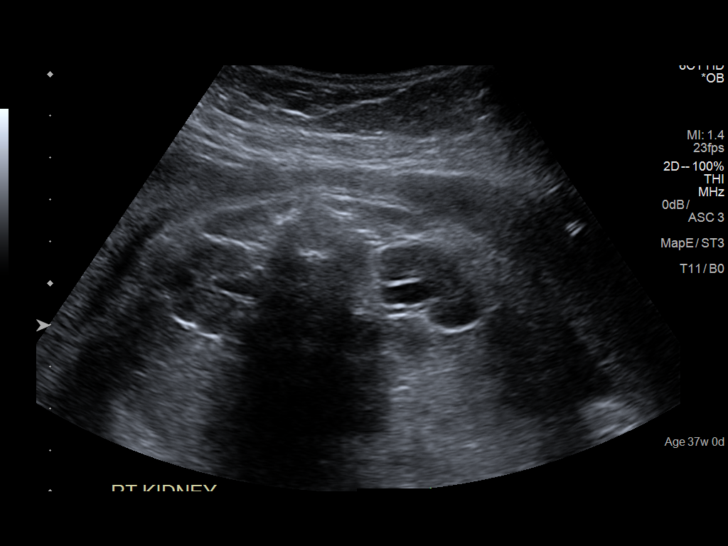
[im 39/48]
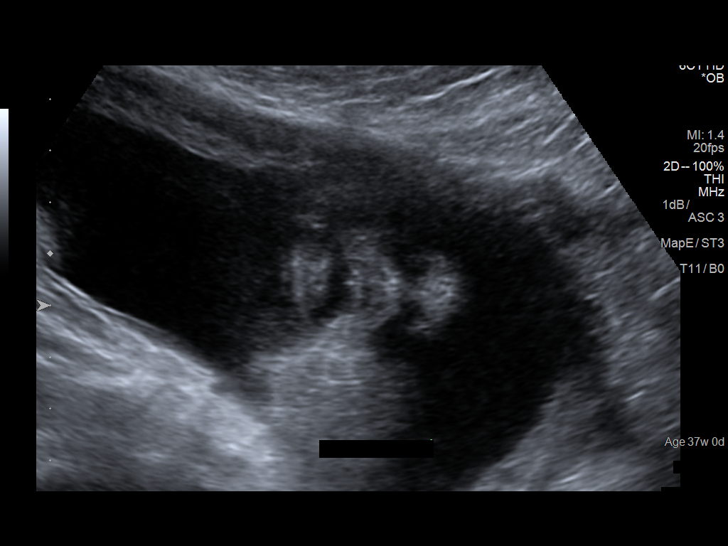
[im 42/48]
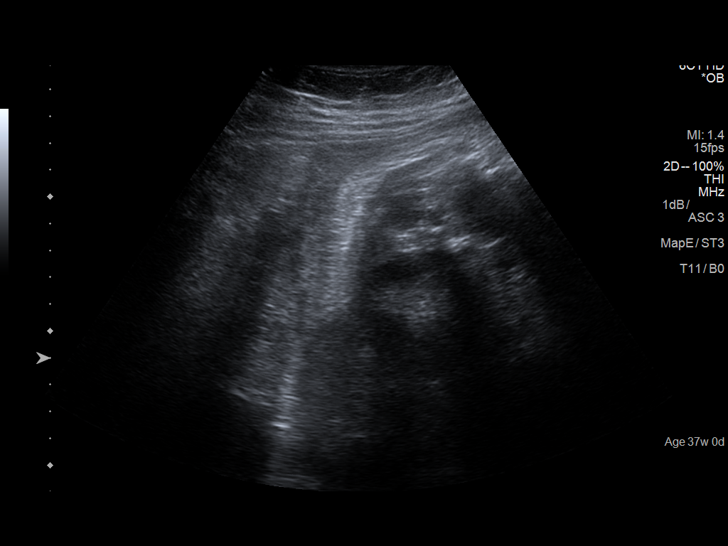
[im 46/48]
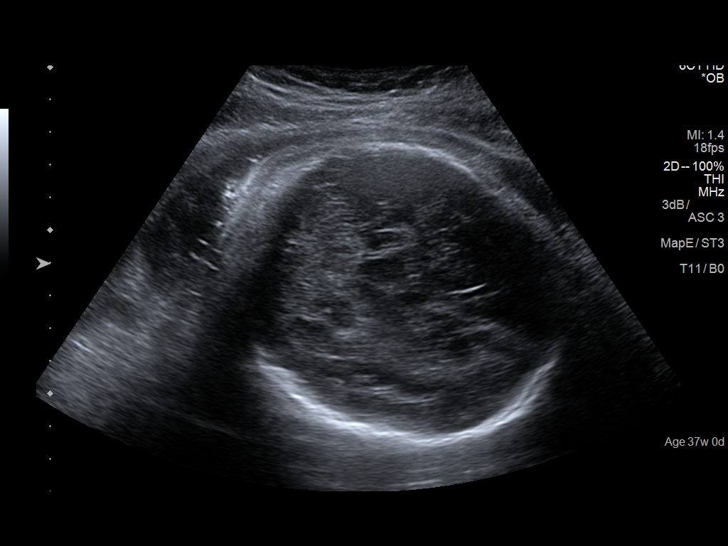

[13 of 28 positions shown; findings below may reference images not displayed]

IMPRESSION: Dear Dr. BLAIN,

Thank you for referring your patient to [facility] for a
fetal growth evaluation.

There is a singleton gestation with normal amniotic fluid
volume.

The fetal biometry correlates with established dating.
Adequate interval growth noted.
The estimated fetal weight is at the 18   percentile. CHENNGWE.

BPP was done and was [DATE].

The patient is scheduled for induction of labor this evening.

Thank you for allowing us to participate in your patient's care.
assistance.

## 2019-09-26 ENCOUNTER — Other Ambulatory Visit: Payer: Self-pay

## 2019-09-26 ENCOUNTER — Ambulatory Visit
Admission: RE | Admit: 2019-09-26 | Discharge: 2019-09-26 | Disposition: A | Discharge status: Home / Self Care | Payer: BLUE CROSS/BLUE SHIELD | Source: Ambulatory Visit | Attending: Obstetrics and Gynecology | Admitting: Obstetrics and Gynecology

## 2019-09-26 ENCOUNTER — Other Ambulatory Visit: Payer: Self-pay | Admitting: Obstetrics and Gynecology

## 2019-09-26 DIAGNOSIS — O200 Threatened abortion: Secondary | ICD-10-CM | POA: Insufficient documentation

## 2019-09-28 ENCOUNTER — Encounter: Payer: Self-pay | Admitting: Emergency Medicine

## 2019-09-28 ENCOUNTER — Observation Stay
Admission: EM | Admit: 2019-09-28 | Discharge: 2019-09-29 | Disposition: A | Discharge status: Home / Self Care | Payer: BLUE CROSS/BLUE SHIELD | Attending: Obstetrics & Gynecology | Admitting: Obstetrics & Gynecology

## 2019-09-28 ENCOUNTER — Other Ambulatory Visit: Payer: Self-pay

## 2019-09-28 DIAGNOSIS — O4690 Antepartum hemorrhage, unspecified, unspecified trimester: Secondary | ICD-10-CM | POA: Diagnosis present

## 2019-09-28 DIAGNOSIS — O034 Incomplete spontaneous abortion without complication: Principal | ICD-10-CM | POA: Diagnosis present

## 2019-09-28 DIAGNOSIS — D62 Acute posthemorrhagic anemia: Secondary | ICD-10-CM | POA: Diagnosis not present

## 2019-09-28 DIAGNOSIS — I10 Essential (primary) hypertension: Secondary | ICD-10-CM | POA: Diagnosis not present

## 2019-09-28 DIAGNOSIS — O039 Complete or unspecified spontaneous abortion without complication: Secondary | ICD-10-CM

## 2019-09-28 LAB — CBC WITH DIFFERENTIAL/PLATELET
Abs Immature Granulocytes: 0.05 10*3/uL (ref 0.00–0.07)
Basophils Absolute: 0.1 10*3/uL (ref 0.0–0.1)
Basophils Relative: 1 %
Eosinophils Absolute: 0.4 10*3/uL (ref 0.0–0.5)
Eosinophils Relative: 3 %
HCT: 42.1 % (ref 36.0–46.0)
Hemoglobin: 14.3 g/dL (ref 12.0–15.0)
Immature Granulocytes: 0 %
Lymphocytes Relative: 31 %
Lymphs Abs: 4.3 10*3/uL — ABNORMAL HIGH (ref 0.7–4.0)
MCH: 29.9 pg (ref 26.0–34.0)
MCHC: 34 g/dL (ref 30.0–36.0)
MCV: 87.9 fL (ref 80.0–100.0)
Monocytes Absolute: 1 10*3/uL (ref 0.1–1.0)
Monocytes Relative: 7 %
Neutro Abs: 7.9 10*3/uL — ABNORMAL HIGH (ref 1.7–7.7)
Neutrophils Relative %: 58 %
Platelets: 318 10*3/uL (ref 150–400)
RBC: 4.79 MIL/uL (ref 3.87–5.11)
RDW: 12.2 % (ref 11.5–15.5)
WBC: 13.8 10*3/uL — ABNORMAL HIGH (ref 4.0–10.5)
nRBC: 0 % (ref 0.0–0.2)

## 2019-09-28 MED ORDER — SODIUM CHLORIDE 0.9 % IV SOLN: Freq: Once | INTRAVENOUS | Status: AC

## 2019-09-28 MED ORDER — LORAZEPAM 2 MG/ML IJ SOLN
1.0000 mg | Freq: Once | INTRAMUSCULAR | Status: AC
  Administered 2019-09-28: 23:00:00 1 mg via INTRAVENOUS
  Filled 2019-09-28: qty 1

## 2019-09-28 NOTE — ED Triage Notes (Signed)
Pt states that she has been having on and off bleeding over the week with the bleeding increasing this evening.

## 2019-09-28 NOTE — ED Provider Notes (Signed)
[facility] Emergency Department Provider Note       Time seen: ----------------------------------------- 11:27 PM on 09/28/2019 -----------------------------------------   I have reviewed the triage vital signs and the nursing notes.  HISTORY   Chief Complaint Vaginal Bleeding    HPI Meghan Lowe is a 39 y.o. female with a history of anxiety, gestational diabetes, headache, hypertension who presents to the ED for heavy vaginal bleeding on and off over the week with worsening bleeding this evening.  She is not having any pain.  Reportedly when she stood up she had a large amount of bleeding.  Past Medical History:  Diagnosis Date  . Anxiety   . Gestational diabetes   . Headache    mirgraines started around the age of 60/39 years old  . Hypertension     Patient Active Problem List   Diagnosis Date Noted  . Hypertension affecting pregnancy in third trimester 04/19/2016  . Pregnancy complicated by fetal cerebral ventriculomegaly 04/12/2016  . Chronic hypertension in pregnancy 03/22/2016  . Pregnancy complicated by fetal cerebral ventriculomegaly, single gestation 03/22/2016  . Gestational diabetes 03/22/2016  . Hypertension in pregnancy, antepartum 03/22/2016    Past Surgical History:  Procedure Laterality Date  . CESAREAN SECTION N/A 04/22/2016   Procedure: CESAREAN SECTION;  Surgeon: Elenora Fender Ward, MD;  Location: [facility] ORS;  Service: Obstetrics;  Laterality: N/A;  . NO PAST SURGERIES      Allergies Fish allergy and Sulfa antibiotics  Social History Social History   Tobacco Use  . Smoking status: Never Smoker  . Smokeless tobacco: Never Used  Substance Use Topics  . Alcohol use: No  . Drug use: No    Review of Systems Constitutional: Negative for fever. Cardiovascular: Negative for chest pain. Respiratory: Negative for shortness of breath. Gastrointestinal: Negative for abdominal pain, vomiting and diarrhea. Genitourinary: Positive  for vaginal bleeding Musculoskeletal: Negative for back pain. Skin: Negative for rash. Neurological: Negative for headaches, focal weakness or numbness.  All systems negative/normal/unremarkable except as stated in the HPI  ____________________________________________   PHYSICAL EXAM:  VITAL SIGNS: ED Triage Vitals  Enc Vitals Group     BP 09/28/19 2310 (!) 162/101     Pulse Rate 09/28/19 2310 (!) 150     Resp 09/28/19 2310 (!) 24     Temp 09/28/19 2310 98.6 F (37 C)     Temp Source 09/28/19 2310 Oral     SpO2 09/28/19 2310 99 %     Weight 09/28/19 2307 208 lb (94.3 kg)     Height 09/28/19 2307 5\' 7"  (1.702 m)     Head Circumference --      Peak Flow --      Pain Score 09/28/19 2306 0     Pain Loc --      Pain Edu? --      Excl. in GC? --     Constitutional: Alert and oriented.  Anxious, mild distress Eyes: Conjunctivae are normal. Normal extraocular movements. Cardiovascular: Rapid rate, regular rhythm. No murmurs, rubs, or gallops. Respiratory: Normal respiratory effort without tachypnea nor retractions. Breath sounds are clear and equal bilaterally. No wheezes/rales/rhonchi. Gastrointestinal: Soft and nontender. Normal bowel sounds Genitourinary: Heavy recent bleeding is noted, no active hemorrhage.  Clots were removed from the cervix and vagina. Musculoskeletal: Nontender with normal range of motion in extremities. No lower extremity tenderness nor edema. Neurologic:  Normal speech and language. No gross focal neurologic deficits are appreciated.  Skin:  Skin is warm, dry and intact.  No rash noted. Psychiatric: Mood and affect are normal. Speech and behavior are normal.  ____________________________________________  ED COURSE:  As part of my medical decision making, I reviewed the following data within the [place] History obtained from family if available, nursing notes, old chart and ekg, as well as notes from prior ED visits. Patient presented  for likely miscarriage, we will assess with labs and imaging as indicated at this time.   Procedures  Meghan Lowe was evaluated in Emergency Department on 09/28/2019 for the symptoms described in the history of present illness. She was evaluated in the context of the global COVID-19 pandemic, which necessitated consideration that the patient might be at risk for infection with the SARS-CoV-2 virus that causes COVID-19. Institutional protocols and algorithms that pertain to the evaluation of patients at risk for COVID-19 are in a state of rapid change based on information released by regulatory bodies including the CDC and federal and state organizations. These policies and algorithms were followed during the patient's care in the ED.  ____________________________________________   LABS (pertinent positives/negatives)  Labs Reviewed  CBC WITH DIFFERENTIAL/PLATELET - Abnormal; Notable for the following components:      Result Value   WBC 13.8 (*)    Neutro Abs 7.9 (*)    Lymphs Abs 4.3 (*)    All other components within normal limits  COMPREHENSIVE METABOLIC PANEL - Abnormal; Notable for the following components:   Sodium 134 (*)    CO2 21 (*)    Glucose, Bld 263 (*)    AST 54 (*)    All other components within normal limits  HCG, QUANTITATIVE, PREGNANCY - Abnormal; Notable for the following components:   hCG, Beta Chain, Quant, S 2,333 (*)    All other components within normal limits  TYPE AND SCREEN  TYPE AND SCREEN  TYPE AND SCREEN    RADIOLOGY Images were viewed by me  Pregnancy ultrasound IMPRESSION: Gestational sac now seen within the lower uterine segment with blood products within the endometrial canal. The findings are suspicious for failed pregnancy. Would recommend correlation with serial beta HCG. This follows SRU consensus guidelines: Diagnostic Criteria for Nonviable Pregnancy Early in the First Trimester. Alison Stalling J Med [phone]. ws SRU consensus guidelines:  Diagnostic Criteria for Nonviable Pregnancy Early in the First Trimester. Alta Corning Med 2013; 147:8295-62.  ____________________________________________   DIFFERENTIAL DIAGNOSIS   Miscarriage, threatened miscarriage, anemia, hemorrhage  FINAL ASSESSMENT AND PLAN  Miscarriage   Plan: The patient had presented for heavy vaginal bleeding. Patient's labs were grossly unremarkable initially.  Patient did have an episode of low blood pressure which seem to be a vasovagal type event after passing a large amount of blood and clots.  Blood pressure dropped to the 60s.  We are giving IV fluids.  Patient's imaging did reveal failed pregnancy as expected.  Patient has intermittent vasovagal events when she passes large clots.  She still having significant bleeding attempts.  I have discussed with GYN on-call who may perform a D&C.   Laurence Aly, MD    Note: This note was generated in part or whole with voice recognition software. Voice recognition is usually quite accurate but there are transcription errors that can and very often do occur. I apologize for any typographical errors that were not detected and corrected.     Earleen Newport, MD 09/29/19 [phone]

## 2019-09-29 ENCOUNTER — Emergency Department: Payer: BLUE CROSS/BLUE SHIELD

## 2019-09-29 DIAGNOSIS — O034 Incomplete spontaneous abortion without complication: Secondary | ICD-10-CM | POA: Diagnosis present

## 2019-09-29 LAB — COMPREHENSIVE METABOLIC PANEL
ALT: 41 U/L (ref 0–44)
AST: 54 U/L — ABNORMAL HIGH (ref 15–41)
Albumin: 4.2 g/dL (ref 3.5–5.0)
Alkaline Phosphatase: 68 U/L (ref 38–126)
Anion gap: 10 (ref 5–15)
BUN: 15 mg/dL (ref 6–20)
CO2: 21 mmol/L — ABNORMAL LOW (ref 22–32)
Calcium: 9.2 mg/dL (ref 8.9–10.3)
Chloride: 103 mmol/L (ref 98–111)
Creatinine, Ser: 0.65 mg/dL (ref 0.44–1.00)
GFR calc Af Amer: >60 mL/min (ref >60)
GFR calc non Af Amer: >60 mL/min (ref >60)
Glucose, Bld: 263 mg/dL — ABNORMAL HIGH (ref 70–99)
Potassium: 4.1 mmol/L (ref 3.5–5.1)
Sodium: 134 mmol/L — ABNORMAL LOW (ref 135–145)
Total Bilirubin: 1 mg/dL (ref 0.3–1.2)
Total Protein: 7.7 g/dL (ref 6.5–8.1)

## 2019-09-29 LAB — CBC
HCT: 27.7 % — ABNORMAL LOW (ref 36.0–46.0)
HCT: 33.8 % — ABNORMAL LOW (ref 36.0–46.0)
Hemoglobin: 9.3 g/dL — ABNORMAL LOW (ref 12.0–15.0)
Hemoglobin: 11.4 g/dL — ABNORMAL LOW (ref 12.0–15.0)
MCH: 30.6 pg (ref 26.0–34.0)
MCH: 29.6 pg (ref 26.0–34.0)
MCHC: 33.6 g/dL (ref 30.0–36.0)
MCHC: 33.7 g/dL (ref 30.0–36.0)
MCV: 91.1 fL (ref 80.0–100.0)
MCV: 87.8 fL (ref 80.0–100.0)
Platelets: 229 10*3/uL (ref 150–400)
Platelets: 212 10*3/uL (ref 150–400)
RBC: 3.04 MIL/uL — ABNORMAL LOW (ref 3.87–5.11)
RBC: 3.85 MIL/uL — ABNORMAL LOW (ref 3.87–5.11)
RDW: 12.3 % (ref 11.5–15.5)
RDW: 13 % (ref 11.5–15.5)
WBC: 14.3 10*3/uL — ABNORMAL HIGH (ref 4.0–10.5)
WBC: 16.6 10*3/uL — ABNORMAL HIGH (ref 4.0–10.5)
nRBC: 0 % (ref 0.0–0.2)
nRBC: 0 % (ref 0.0–0.2)

## 2019-09-29 LAB — HCG, QUANTITATIVE, PREGNANCY: hCG, Beta Chain, Quant, S: 2333 m[IU]/mL — ABNORMAL HIGH (ref <5)

## 2019-09-29 LAB — PREPARE RBC (CROSSMATCH)

## 2019-09-29 MED ORDER — ONDANSETRON HCL 4 MG/2ML IJ SOLN
4.0000 mg | Freq: Once | INTRAMUSCULAR | Status: AC
  Administered 2019-09-29: 4 mg via INTRAVENOUS

## 2019-09-29 MED ORDER — AZITHROMYCIN 1 G PO PACK
1.0000 g | PACK | Freq: Once | ORAL | Status: AC
  Administered 2019-09-29: 1 g via ORAL
  Filled 2019-09-29: qty 1

## 2019-09-29 MED ORDER — ONDANSETRON HCL 4 MG PO TABS: 4.0000 mg | ORAL_TABLET | Freq: Four times a day (QID) | ORAL | Status: DC | PRN

## 2019-09-29 MED ORDER — PROMETHAZINE HCL 25 MG PO TABS
12.5000 mg | ORAL_TABLET | Freq: Four times a day (QID) | ORAL | Status: DC | PRN
  Filled 2019-09-29: qty 1

## 2019-09-29 MED ORDER — RHO D IMMUNE GLOBULIN 1500 UNIT/2ML IJ SOSY
50.0000 ug | PREFILLED_SYRINGE | Freq: Once | INTRAMUSCULAR | Status: AC
  Administered 2019-09-29: 49.5 ug via INTRAMUSCULAR
  Filled 2019-09-29: qty 2

## 2019-09-29 MED ORDER — ACETAMINOPHEN 500 MG PO TABS: 1000.0000 mg | ORAL_TABLET | Freq: Four times a day (QID) | ORAL | Status: DC | PRN

## 2019-09-29 MED ORDER — LACTATED RINGERS IV SOLN: INTRAVENOUS | Status: DC

## 2019-09-29 MED ORDER — SODIUM CHLORIDE 0.9 % IV BOLUS
1000.0000 mL | Freq: Once | INTRAVENOUS | Status: AC
  Administered 2019-09-29: 1000 mL via INTRAVENOUS

## 2019-09-29 MED ORDER — MISOPROSTOL 200 MCG PO TABS
800.0000 ug | ORAL_TABLET | Freq: Once | ORAL | Status: AC
  Administered 2019-09-29: 800 ug via VAGINAL
  Filled 2019-09-29: qty 4

## 2019-09-29 MED ORDER — ONDANSETRON HCL 4 MG/2ML IJ SOLN
4.0000 mg | Freq: Once | INTRAMUSCULAR | Status: AC
  Administered 2019-09-29: 4 mg via INTRAVENOUS
  Filled 2019-09-29: qty 2

## 2019-09-29 MED ORDER — IBUPROFEN 600 MG PO TABS
600.0000 mg | ORAL_TABLET | Freq: Four times a day (QID) | ORAL | Status: DC | PRN
  Filled 2019-09-29: qty 1

## 2019-09-29 MED ORDER — METHYLERGONOVINE MALEATE 0.2 MG PO TABS
0.2000 mg | ORAL_TABLET | Freq: Four times a day (QID) | ORAL | Status: DC
  Administered 2019-09-29: 0.2 mg via ORAL
  Filled 2019-09-29 (×5): qty 1

## 2019-09-29 MED ORDER — PROMETHAZINE HCL 25 MG/ML IJ SOLN: 12.5000 mg | Freq: Four times a day (QID) | INTRAMUSCULAR | Status: DC | PRN

## 2019-09-29 MED ORDER — LORAZEPAM 2 MG/ML IJ SOLN
0.5000 mg | Freq: Once | INTRAMUSCULAR | Status: AC
  Administered 2019-09-29: 0.5 mg via INTRAVENOUS
  Filled 2019-09-29: qty 1

## 2019-09-29 MED ORDER — MORPHINE SULFATE (PF) 2 MG/ML IV SOLN
2.0000 mg | Freq: Once | INTRAVENOUS | Status: AC
  Administered 2019-09-29: 2 mg via INTRAVENOUS
  Filled 2019-09-29: qty 1

## 2019-09-29 MED ORDER — SODIUM CHLORIDE 0.9 % IV SOLN: Freq: Once | INTRAVENOUS | Status: AC

## 2019-09-29 MED ORDER — TRANEXAMIC ACID-NACL 1000-0.7 MG/100ML-% IV SOLN
1000.0000 mg | INTRAVENOUS | Status: AC
  Administered 2019-09-29: 1000 mg via INTRAVENOUS
  Filled 2019-09-29: qty 100

## 2019-09-29 MED ORDER — METHYLERGONOVINE MALEATE 0.2 MG PO TABS: 0.2000 mg | ORAL_TABLET | Freq: Four times a day (QID) | ORAL | 0 refills | Status: DC

## 2019-09-29 MED ORDER — ONDANSETRON HCL 4 MG/2ML IJ SOLN
INTRAMUSCULAR | Status: AC
  Filled 2019-09-29: qty 2

## 2019-09-29 MED ORDER — ONDANSETRON HCL 4 MG/2ML IJ SOLN: 4.0000 mg | Freq: Four times a day (QID) | INTRAMUSCULAR | Status: DC | PRN

## 2019-09-29 MED ORDER — METHYLERGONOVINE MALEATE 0.2 MG/ML IJ SOLN
0.2000 mg | Freq: Once | INTRAMUSCULAR | Status: AC
  Administered 2019-09-29: 0.2 mg via INTRAMUSCULAR
  Filled 2019-09-29: qty 1

## 2019-09-29 NOTE — Progress Notes (Signed)
Patient was recommended admission and observation until noonish, to eval for bleeding and physical improvement.  If her bleeding did not improve, she would go to OR for D&E.  She agreed initially to go upstairs for this, however changed her mind and told the RN that she wanted to go home.  Declined COVID testing.  Will keep her in ED until she shows she is stable for discharge.  If she is determined to be stable, she will have her admission canceled. If not, we will again strongly encourage admission for observation.  Azithromycin and Methergine PO given  CBC pending draw @ 10am ish Ok to advance diet.   ----- Ranae Plumber, MD, FACOG Attending Obstetrician and Gynecologist [facility], Department of OB/GYN [facility]

## 2019-09-29 NOTE — ED Provider Notes (Addendum)
Hemoglobin dropped from 14 to 9 while in the ER.  Dr. Elesa Massed is performing a D&C in the room.   Emily Filbert, MD 09/29/19 0355    Emily Filbert, MD 09/29/19 [phone]

## 2019-09-29 NOTE — ED Notes (Signed)
First unit of blood hung and verified prior to starting with [place], rn. Per dr. Mayford Knife "run it wide open". Blood warmer initiated. Per dr. Elesa Massed can slow blood down to normal rate.

## 2019-09-29 NOTE — ED Notes (Signed)
Pt cleansed of blood around pubic area.

## 2019-09-29 NOTE — ED Notes (Signed)
Pt ambulatory with standby assistance 

## 2019-09-29 NOTE — Progress Notes (Signed)
Pt transferred to Room 343. Bleeding is scant to small amount on pad, no clots. Pt's husband at bedside.

## 2019-09-29 NOTE — ED Notes (Signed)
Pt with worsening bleeding. Pt states feels shob, sweaty. Pt placed on oxygen at 2lpm for sho. md at bedside. Pt pale, lips pale, skin slightly diaphoretic. Blood pressure decreasing.

## 2019-09-29 NOTE — ED Notes (Addendum)
Lab tech at bedside. Ultrasound tech at bedside waiting to transport patient

## 2019-09-29 NOTE — Progress Notes (Signed)
MD notified of pt transferred to floor.

## 2019-09-29 NOTE — ED Notes (Addendum)
Spoke with the pt on why she did not want to stay in the hospital. Pt states that she did not want to have a COVID test done. I asked if she could refuse the COVID would she be inclined to stay in the hospital and pt agreed. Attempted to contact Dr. Elesa Massed, MD.

## 2019-09-29 NOTE — ED Notes (Addendum)
Pt cleansed of saturated chux pad. Pt continues to have intermittent heavy vaginal bleeding. Pt states she feels "like I did before" when her blood pressure dropped. Pt placed in trendelenburg position. md notified.

## 2019-09-29 NOTE — ED Notes (Signed)
Report received from April, RN. Pt currently resting in ED stretcher. Pt has no complaints at this time. Husband at bedside. Pt A&Ox4 and NAD at this time. Will continue to monitor.

## 2019-09-29 NOTE — ED Notes (Signed)
Waiting on methergine from pharmacy.

## 2019-09-29 NOTE — ED Notes (Signed)
Pt sipping on water with md permission, bleeding has improved. Dr. Elesa Massed at bedside.

## 2019-09-29 NOTE — ED Provider Notes (Signed)
CRITICAL CARE Performed by: Ulice Dash   Total critical care time: 30 minutes  Critical care time was exclusive of separately billable procedures and treating other patients.  Critical care was necessary to treat or prevent imminent or life-threatening deterioration.  Critical care was time spent personally by me on the following activities: development of treatment plan with patient and/or surrogate as well as nursing, discussions with consultants, evaluation of patient's response to treatment, examination of patient, obtaining history from patient or surrogate, ordering and performing treatments and interventions, ordering and review of laboratory studies, ordering and review of radiographic studies, pulse oximetry and re-evaluation of patient's condition.   Patient has dropped her pressure on several occasions, appears pale.  I think this is acute blood loss anemia.  I am ordering an emergency transfusion.   Emily Filbert, MD 09/29/19 [phone]

## 2019-09-29 NOTE — ED Notes (Addendum)
Pt's HR increased to 150 and BP decreased to 61/31. EDP called to bedside promptly. Orders placed and pt was placed in supine position to aid with BP compensation ./ Pt A&Ox4, skin warm. Pt diaphoretic at this time

## 2019-09-29 NOTE — Progress Notes (Signed)
I was not at all made aware of this patient's decision to stay, nor that she was told it would be ok to stay without COVID testing.    Per RN [place] Scrape, her bleeding from 7am was a moderate area of a bed pad, no clots.    Patient to go home at noon.  Discharge order in for RN to release. No bleeding on ambulation.   ----- Ranae Plumber, MD, FACOG Attending Obstetrician and Gynecologist [facility], Department of OB/GYN [facility]

## 2019-09-29 NOTE — ED Notes (Signed)
Pt returned from ultrasound

## 2019-09-29 NOTE — ED Notes (Signed)
Transfusion rate increased to 265mL/hr from 153ml/hr, no s/s of transfusion reaction noted.

## 2019-09-29 NOTE — ED Notes (Signed)
Weighed removed blood clots, blood per dr. Elesa Massed. Weight 0.375kg. dr. Elesa Massed notified.

## 2019-09-29 NOTE — Discharge Instructions (Signed)
You should expect to have some cramping and vaginal bleeding for about a week. This should taper off and subside, much like a period. If heavy bleeding continues or gets worse, you should contact the office for an earlier appointment.   Please call the office or physician on call for fever >101, severe pain, and heavy bleeding.   [phone]  NOTHING IN THE VAGINA FOR 2 WEEKS!!  Please take the methergine every 6 hours for 8 doses.  You may take ibuprofen and tylenol together for pain relief if needed.    No activity restrictions.  Scrabble marathon highly recommended.

## 2019-09-29 NOTE — ED Notes (Signed)
Dr. Ward at bedside.

## 2019-09-29 NOTE — ED Notes (Signed)
Pt states she does not want to have covid swab and does not want to stay in the hospital. Dr. Elesa Massed notified, will hold pt in ed, recheck cbc as ordered and monitor for stability. Pt updated and verbalizes understanding.

## 2019-09-29 NOTE — ED Notes (Signed)
Pt c/o nausea at this time and was given an antiemetic. See [facility]

## 2019-09-29 NOTE — ED Notes (Signed)
Report to ashley, rn

## 2019-09-29 NOTE — ED Notes (Signed)
Dr. Elesa Massed remains at bedside.

## 2019-09-29 NOTE — ED Notes (Signed)
Rounded on pt, states she passed another clot. Upon inspection pt had passed another large clot. PT placed on a clean pad. PT noted to be shaking, pt states she thinks anxiety medication is wearing off. Informed MD. See MAR.

## 2019-09-29 NOTE — Discharge Summary (Signed)
Gynecology Discharge Summary  Patient ID: Meghan Lowe MRN: [id number] DOB/AGE: [birth year] 39 y.o.  Admit Date: 09/28/2019 Discharge Date: 09/29/2019  Admission Diagnoses: incomplete AB and acute blood loss anemia  Discharge Diagnoses: same  Procedures:  blood transfusion, dilation and curettage  CBC Latest Ref Rng & Units 09/29/2019 09/29/2019 09/28/2019  WBC 4.0 - 10.5 K/uL 16.6(H) 14.3(H) 13.8(H)  Hemoglobin 12.0 - 15.0 g/dL 11.4(L) 9.3(L) 14.3  Hematocrit 36.0 - 46.0 % 33.8(L) 27.7(L) 42.1  Platelets 150 - 400 K/uL 212 229 318    Hospital Course:  Jerilyn Gillaspie is a 39 y.o. G2P1011  admitted for incomplete AB. She was treated with dilation and curettage in ED, received 2 units of blood, rhogam, cytotec, methergine, and azithromycin. By time of discharge on HD#1, her bleeding was as expected for a miscarriage, and not hemorrhaging. She was deemed stable for discharge to home.    Discharge Exam: BP 100/62 (BP Location: Right Arm)   Pulse (!) 108   Temp 98.7 F (37.1 C) (Oral)   Resp 18   Ht 5\' 7"  (1.702 m)   Wt 94.3 kg   SpO2 97%   BMI 32.58 kg/m  General appearance: alert and no distress  Resp: clear to auscultation bilaterally, normal respiratory effort Cardio: regular rate and rhythm  GI: soft, non-tender; bowel sounds normal; no masses, no organomegaly.  Pelvic: minimal to moderate blood on pad  Extremities: extremities normal, atraumatic, no cyanosis or edema and Homans sign is negative, no sign of DVT  Discharged Condition: Stable  Disposition: Discharge disposition: 01-Home or Self Care       Discharge Instructions    Diet - low sodium heart healthy   Complete by: As directed    Increase activity slowly   Complete by: As directed      Allergies as of 09/29/2019      Reactions   Fish Allergy    Sulfa Antibiotics Nausea And Vomiting      Medication List    STOP taking these medications   aspirin 81 MG chewable tablet   ibuprofen 600 MG tablet Commonly  known as: ADVIL   oxyCODONE 5 MG immediate release tablet Commonly known as: Oxy IR/ROXICODONE     TAKE these medications   methylergonovine 0.2 MG tablet Commonly known as: METHERGINE Take 1 tablet (0.2 mg total) by mouth every 6 (six) hours.      Follow-up Information    Maris Abascal, 10/01/2019, MD. Schedule an appointment as soon as possible for a visit in 3 week(s).   Specialty: Obstetrics and Gynecology Why: 2-3 weeks for follow up Contact information: [address] [place] [phone]           Signed:  [phone] Anokhi Shannon Attending Obstetrician & Gynecologist [facility]

## 2019-09-29 NOTE — Consult Note (Signed)
Consult History and Physical   SERVICE: Gynecology   Patient Name: Meghan Lowe Patient MRN:   [id number]  CC: hemorrhage  HPI: Meghan Lowe is a 39 y.o. G2P1001 with anembryonic pregnancy diagnosed recently, and began bleeding heavily today.  She was seen in the office earlier this week and given precautions.   She was surprised at the amount of blood and presented to the ED.  I was paged due to concern for blood loss. She is lightheaded and dizzy and the nurses are at the bedside initiating blood transfusion during my interview and exam.   Ultrasound showed the prior fundal gestational sac in the LUS and just at the cervix.    Review of Systems: positives in bold GEN:   fevers, chills, weight changes, appetite changes, fatigue, night sweats HEENT:  HA, vision changes, hearing loss, congestion, rhinorrhea, sinus pressure, dysphagia CV:   CP, palpitations PULM:  SOB, cough GI:  abd pain, N/V/D/C GU:  dysuria, urgency, frequency MSK:  arthralgias, myalgias, back pain, swelling SKIN:  rashes, color changes, pallor NEURO:  numbness, weakness, tingling, seizures, dizziness, tremors PSYCH:  depression, anxiety, behavioral problems, confusion  HEME/LYMPH:  easy bruising or bleeding ENDO:  heat/cold intolerance  Past Obstetrical History: OB History    Gravida  2   Para  1   Term  1   Preterm      AB      Living  1     SAB      TAB      Ectopic      Multiple  0   Live Births  1           Past Gynecologic History: No LMP recorded. Patient is pregnant.  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 Anxiety   . Gestational diabetes   . Headache    mirgraines started around the age of [age] years old  . Hypertension     Past Surgical History:   Past Surgical History:  Procedure Laterality Date  . CESAREAN SECTION N/A 04/22/2016   Procedure: CESAREAN SECTION;   Surgeon: Honor Loh Shellyann Wandrey, MD;  Location: [facility] ORS;  Service: Obstetrics;  Laterality: N/A;  . NO PAST SURGERIES      Family History:  family history includes Diabetes in her father; Heart attack in her paternal grandmother; Hypertension in her mother; Neurologic Disorder in her mother.  Social History:  Social History   Occupational History  . Not on file  Tobacco Use  . Smoking status: Never Smoker  . Smokeless tobacco: Never Used  Substance and Sexual Activity  . Alcohol use: No  . Drug use: No  . Sexual activity: Yes     Home Medications:  Medications reconciled in EPIC  No current facility-administered medications on file prior to encounter.   Current Outpatient Medications on File Prior to Encounter  Medication Sig Dispense Refill  . aspirin 81 MG chewable tablet Chew 1 tablet (81 mg total) by mouth daily. (Patient not taking: Reported on 09/29/2019) 30 tablet 3  . ibuprofen (ADVIL,MOTRIN) 600 MG tablet Take 1 tablet (600 mg total) by mouth every 6 (six) hours. (Patient not taking: Reported on 09/29/2019) 30 tablet 0  . oxyCODONE (OXY IR/ROXICODONE) 5 MG immediate release tablet Take 1 tablet (5 mg total) by mouth every 4 (four) hours as needed (pain scale 4-7). (Patient not taking: Reported on 09/29/2019) 20 tablet 0  Allergies:  Allergies  Allergen Reactions  . Fish Allergy   . Sulfa Antibiotics Nausea And Vomiting    Physical Exam:  Temp:  [97.7 F (36.5 C)-98.7 F (37.1 C)] 98.7 F (37.1 C) (02/27 0438) Pulse Rate:  [88-150] 100 (02/27 0545) Resp:  [12-24] 14 (02/27 0545) BP: (61-162)/(31-101) 107/75 (02/27 0545) SpO2:  [96 %-100 %] 100 % (02/27 0545) Weight:  [94.3 kg] 94.3 kg (02/26 2307)   General Appearance:  Pale and diaphoretic, lying supine in bed.   HEENT:  Normocephalic atraumatic, extraocular movements intact,  Cardiovascular:  No edema Pulmonary:  Normal respiratory effort no wheezes, rales or rhonchi, symmetric air entry, good air  exchange Abdomen:  Bowel sounds present, soft, nontender, nondistended, no abnormal masses or organomegaly, no epigastric pain Extremities:  extremities normal, no tenderness, atraumatic, no cyanosis or edema Skin:  normal coloration and turgor, no rashes, no suspicious skin lesions noted  Neurologic:  Cranial nerves 2-12 grossly intact, grossly equal strength and muscle tone, normal speech, no focal findings or movement disorder noted. Psychiatric:  Normal mood and affect, appropriate, no AH/VH Pelvic:  NEFG, no vulvar masses or lesions, There are large clots in the vagina. Once removed reveals normal vaginal mucosa, cervix without lesions or erythema, dilated to 1-2cm, with both bright red and dark red blood and clots coming out.      Labs/Studies:  Results for orders placed or performed during the hospital encounter of 09/28/19 (from the past 24 hour(s))  CBC with Differential     Status: Abnormal   Collection Time: 09/28/19 11:26 PM  Result Value Ref Range   WBC 13.8 (H) 4.0 - 10.5 K/uL   RBC 4.79 3.87 - 5.11 MIL/uL   Hemoglobin 14.3 12.0 - 15.0 g/dL   HCT 14.4 81.8 - 56.3 %   MCV 87.9 80.0 - 100.0 fL   MCH 29.9 26.0 - 34.0 pg   MCHC 34.0 30.0 - 36.0 g/dL   RDW 14.9 70.2 - 63.7 %   Platelets 318 150 - 400 K/uL   nRBC 0.0 0.0 - 0.2 %   Neutrophils Relative % 58 %   Neutro Abs 7.9 (H) 1.7 - 7.7 K/uL   Lymphocytes Relative 31 %   Lymphs Abs 4.3 (H) 0.7 - 4.0 K/uL   Monocytes Relative 7 %   Monocytes Absolute 1.0 0.1 - 1.0 K/uL   Eosinophils Relative 3 %   Eosinophils Absolute 0.4 0.0 - 0.5 K/uL   Basophils Relative 1 %   Basophils Absolute 0.1 0.0 - 0.1 K/uL   Immature Granulocytes 0 %   Abs Immature Granulocytes 0.05 0.00 - 0.07 K/uL  Comprehensive metabolic panel     Status: Abnormal   Collection Time: 09/28/19 11:26 PM  Result Value Ref Range   Sodium 134 (L) 135 - 145 mmol/L   Potassium 4.1 3.5 - 5.1 mmol/L   Chloride 103 98 - 111 mmol/L   CO2 21 (L) 22 - 32 mmol/L    Glucose, Bld 263 (H) 70 - 99 mg/dL   BUN 15 6 - 20 mg/dL   Creatinine, Ser 8.58 0.44 - 1.00 mg/dL   Calcium 9.2 8.9 - 85.0 mg/dL   Total Protein 7.7 6.5 - 8.1 g/dL   Albumin 4.2 3.5 - 5.0 g/dL   AST 54 (H) 15 - 41 U/L   ALT 41 0 - 44 U/L   Alkaline Phosphatase 68 38 - 126 U/L   Total Bilirubin 1.0 0.3 - 1.2 mg/dL   GFR calc non  Af Amer >60 >60 mL/min   GFR calc Af Amer >60 >60 mL/min   Anion gap 10 5 - 15  hCG, quantitative, pregnancy     Status: Abnormal   Collection Time: 09/28/19 11:26 PM  Result Value Ref Range   hCG, Beta Chain, Quant, S 2,333 (H) <5 mIU/mL  Type and screen [facility]     Status: None (Preliminary result)   Collection Time: 09/28/19 11:26 PM  Result Value Ref Range   ABO/RH(D) PENDING    Antibody Screen PENDING    Sample Expiration      10/01/2019,2359 Performed at [facility], [address], [place], [place] [address]   Type and screen Ordered by PROVIDER DEFAULT     Status: None (Preliminary result)   Collection Time: 09/28/19 11:57 PM  Result Value Ref Range   ABO/RH(D) PENDING    Antibody Screen PENDING    Sample Expiration      10/01/2019,2359 Performed at [facility], [address] [place], [place] [address]   Type and screen Ordered by PROVIDER DEFAULT     Status: None (Preliminary result)   Collection Time: 09/29/19  1:13 AM  Result Value Ref Range   ABO/RH(D) O NEG    Antibody Screen NEG    Sample Expiration      10/02/2019,2359 Performed at [facility], [address], [place], [place] [address]    Unit Number [id number]    Blood Component Type RED CELLS,LR    Unit division 00    Status of Unit ISSUED    Transfusion Status OK TO TRANSFUSE    Crossmatch Result Compatible    Unit Number [id number]    Blood Component Type RED CELLS,LR    Unit division 00    Status of Unit ISSUED    Transfusion Status OK TO TRANSFUSE    Crossmatch Result Compatible    Unit Number  [id number]    Blood Component Type RED CELLS,LR    Unit division 00    Status of Unit ALLOCATED    Transfusion Status OK TO TRANSFUSE    Crossmatch Result Compatible    Unit Number [id number]    Blood Component Type RED CELLS,LR    Unit division 00    Status of Unit ALLOCATED    Transfusion Status OK TO TRANSFUSE    Crossmatch Result Compatible   Rhogam injection     Status: None (Preliminary result)   Collection Time: 09/29/19  1:13 AM  Result Value Ref Range   Unit Number [id number]    Blood Component Type RHIG    Unit division 00    Status of Unit ISSUED    Transfusion Status      OK TO TRANSFUSE Performed at [facility], [address], [place], [place] [address]   Prepare RBC (crossmatch)     Status: None   Collection Time: 09/29/19  2:43 AM  Result Value Ref Range   Order Confirmation      ORDER PROCESSED BY BLOOD BANK Performed at [facility], [address] [place], [place] [address]   CBC     Status: Abnormal   Collection Time: 09/29/19  2:47 AM  Result Value Ref Range   WBC 14.3 (H) 4.0 - 10.5 K/uL   RBC 3.04 (L) 3.87 - 5.11 MIL/uL   Hemoglobin 9.3 (L) 12.0 - 15.0 g/dL   HCT 90.3 (L) 00.9 - 23.3 %   MCV 91.1 80.0 - 100.0 fL  MCH 30.6 26.0 - 34.0 pg   MCHC 33.6 30.0 - 36.0 g/dL   RDW 62.2 29.7 - 98.9 %   Platelets 229 150 - 400 K/uL   nRBC 0.0 0.0 - 0.2 %  Prepare RBC     Status: None   Collection Time: 09/29/19  3:30 AM  Result Value Ref Range   Order Confirmation      ORDER PROCESSED BY BLOOD BANK Performed at [facility], [address] [place], [place] [address]      TVUS: US OB LESS THAN 14 WEEKS WITH OB TRANSVAGINAL  Result Date: 09/29/2019 CLINICAL DATA:  Vaginal bleeding EXAM: OBSTETRIC <14 WK [place] AND TRANSVAGINAL OB US TECHNIQUE: Both transabdominal and transvaginal ultrasound examinations were performed for complete evaluation of the gestation as well as the maternal uterus, adnexal regions, and  pelvic cul-de-sac. Transvaginal technique was performed to assess early pregnancy. COMPARISON:  September 26, 2019 FINDINGS: Intrauterine gestational sac: Single Yolk sac:  Not Visualized. Embryo:  Not Visualized. MSD: 19.0 mm   6 w   6 d Subchorionic hemorrhage:  None visualized. Maternal uterus/adnexae: The gestational sac appears to be now within the lower uterine segment with heterogeneous probable blood clots within the endometrial canal. Within the left ovary there is a 1.1 cm anechoic cyst. The right ovary is normal in appearance. IMPRESSION: Gestational sac now seen within the lower uterine segment with blood products within the endometrial canal. The findings are suspicious for failed pregnancy. Would recommend correlation with serial beta HCG. This follows SRU consensus guidelines: Diagnostic Criteria for Nonviable Pregnancy Early in the First Trimester. Macy Mis J Med [phone]. ws SRU consensus guidelines: Diagnostic Criteria for Nonviable Pregnancy Early in the First Trimester. Malva Limes Med 2013; 314:9702-63. Electronically Signed   By: Jonna Clark M.D.   On: 09/29/2019 02:17   US OB LESS THAN 14 WEEKS WITH OB TRANSVAGINAL  Result Date: 09/26/2019 CLINICAL DATA:  First trimester pregnancy with vaginal bleeding for 4 days. Threatened abortion. LMP 06/29/2019. EXAM: OBSTETRIC <14 WK [place] AND TRANSVAGINAL OB US TECHNIQUE: Both transabdominal and transvaginal ultrasound examinations were performed for complete evaluation of the gestation as well as the maternal uterus, adnexal regions, and pelvic cul-de-sac. Transvaginal technique was performed to assess early pregnancy. COMPARISON:  None. FINDINGS: Intrauterine gestational sac: Visualized. Yolk sac:  Not visualized. Embryo:  Not visualized. Cardiac Activity: Not visualized. MSD: 19 mm   6 w   6 d Subchorionic hemorrhage:  None visualized. Maternal uterus/adnexae: Probable small corpus luteum on the left measuring up to 16 mm. No suspicious adnexal  findings. The right ovary is not visualized. No significant free pelvic fluid. IMPRESSION: Intrauterine gestational sac without visible embryo or suspicious adnexal findings. Findings are suspicious but not yet definitive for failed pregnancy. Recommend follow-up US in 10-14 days for definitive diagnosis. This recommendation follows SRU consensus guidelines: Diagnostic Criteria for Nonviable Pregnancy Early in the First Trimester. Malva Limes Med 2013; [id number]:8850-27. Electronically Signed   By: Carey Bullocks M.D.   On: 09/26/2019 15:20     Assessment / Plan:   Meghan Lowe is a 39 y.o. G2P1001 who presents with incomplete AB   This miscarriage in process is nearing completion without intervention, however the bleeding has been robust, and patient is being given blood and fluids as well as pain control by the ED staff.  I offered assistance to decrease bleeding and was accepted.  She was given 1g TXA, 1g azithromycin, anti nausea and anti anxiety  meds, as well as pain control.  She is Rh negative and was given rhogam IM.  Patient refused covid screening.  Seeing the situation as imminent and weighing the amount of resources needed to perform this procedure in the OR vs at the bedside in the ED, I decided to continue at the bedside.  Patient agreed and consented.  Procedure:  A lighted speculum was inserted into the vagina Blood clots were extracted with a sterile sponge stick The cervix was washed with betadine A sterile sharp curette was inserted into the cervix and with gentle motion, the contents of the uterus were evacuated.   Bedside ultrasound confirmed the absence of the previously noted gestational sac, as well as a prominent and homogenous endometrial stripe. Cytotec was given rectally, and methergine was given IM for bleeding post procedure.  There was a significant improvement in active bleeding and all instruments were removed.   The clots and tissue were collected and weighed and in  sum, approx 300cc were lost since my arrival to her room.    1. Have been at bedside since 2:45AM.  (approx 3.5 hours).   2. 300cc blood loss since I have been present. 3. Incomplete AB - gestation sac seen in LUS on [place] today, a change from fundal at previous US.  Still anembryonic.   4. D&C performed at bedside, with some clot, Decidua, and Sac retrieved.  Done with ultrasound confirmation.  5. Methergine and cytotec given at bedside, methergine will be continued postop x 48 hours.  No further antibiotics are necessary.   6. If the patient is willing to have Covid testing, she may report to the floor for recovery and subsequent discharge.  She should be observed at least until noon, and if disposition is stable, then may be discharged to home.  If her condition worsens, please notify me of this change.    Thank you for the opportunity to be involved with this patient's care.  ----- Ranae Plumber, MD Attending Obstetrician and Gynecologist [facility], Department of OB/GYN [facility]

## 2019-10-01 LAB — BPAM RBC
Blood Product Expiration Date: 202103052359
Blood Product Expiration Date: 202103062359
Blood Product Expiration Date: 202103102359
Blood Product Expiration Date: 202103132359
ISSUE DATE / TIME: 202102270249
ISSUE DATE / TIME: 202102270249
Unit Type and Rh: 9500
Unit Type and Rh: 9500
Unit Type and Rh: 9500
Unit Type and Rh: 9500

## 2019-10-01 LAB — TYPE AND SCREEN
ABO/RH(D): O NEG
Antibody Screen: NEGATIVE
Unit division: 0
Unit division: 0
Unit division: 0
Unit division: 0

## 2019-10-01 LAB — PREPARE RBC (CROSSMATCH)

## 2019-10-01 LAB — RHOGAM INJECTION: Unit division: 0

## 2020-09-02 IMAGING — US US OB < 14 WEEKS - US OB TV
1 series · 13 of 28 positions shown · non-contrast
Comparison: None.

CLINICAL DATA: First trimester pregnancy with vaginal bleeding for
4 days. Threatened abortion. LMP 06/29/2019.

EXAM:
OBSTETRIC <14 WK US AND TRANSVAGINAL OB US
TECHNIQUE: Both transabdominal and transvaginal ultrasound examinations were
performed for complete evaluation of the gestation as well as the
maternal uterus, adnexal regions, and pelvic cul-de-sac.
Transvaginal technique was performed to assess early pregnancy.

[Series 1: us ob < 14 weeks - us ob tv · 13 of 115 slices shown]
[im 5/115]
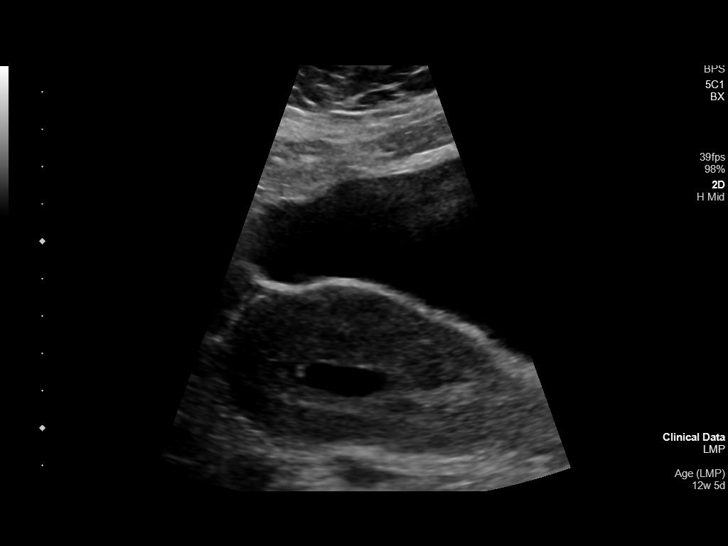
[im 13/115]
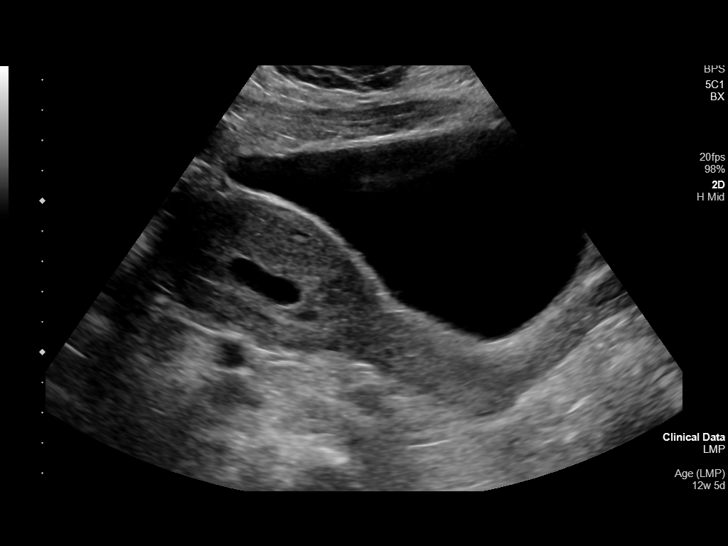
[im 22/115]
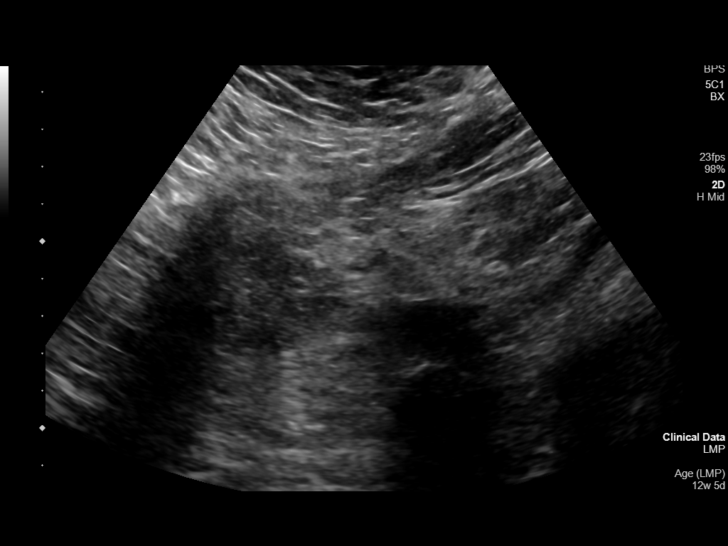
[im 30/115]
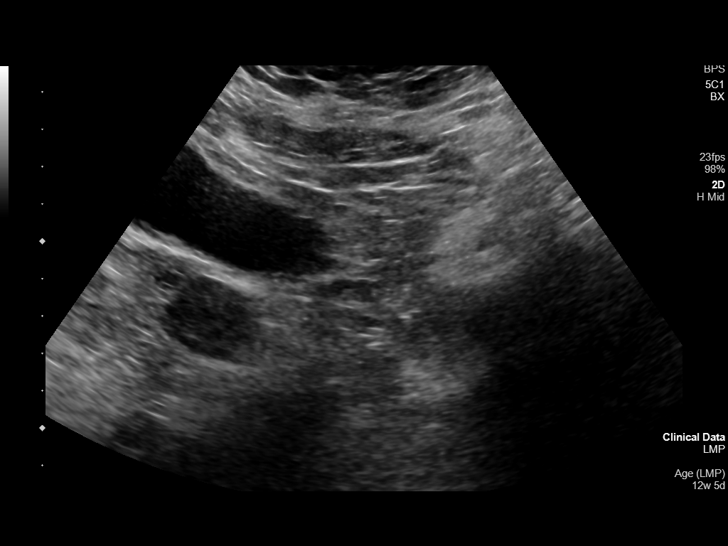
[im 39/115]
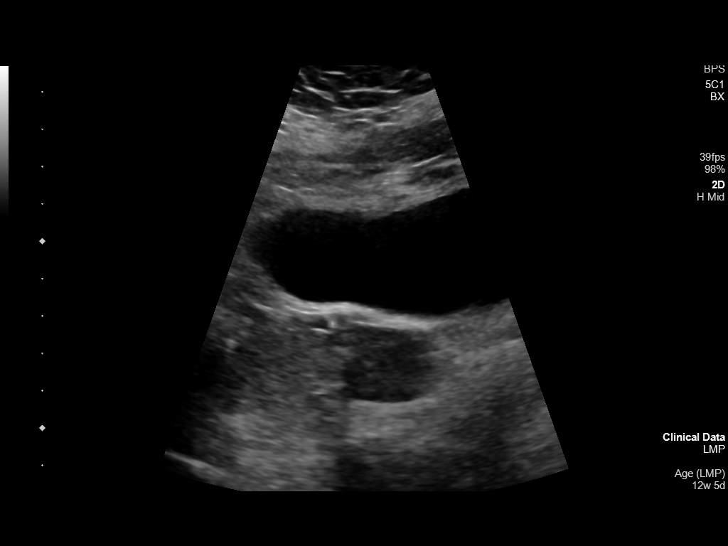
[im 47/115]
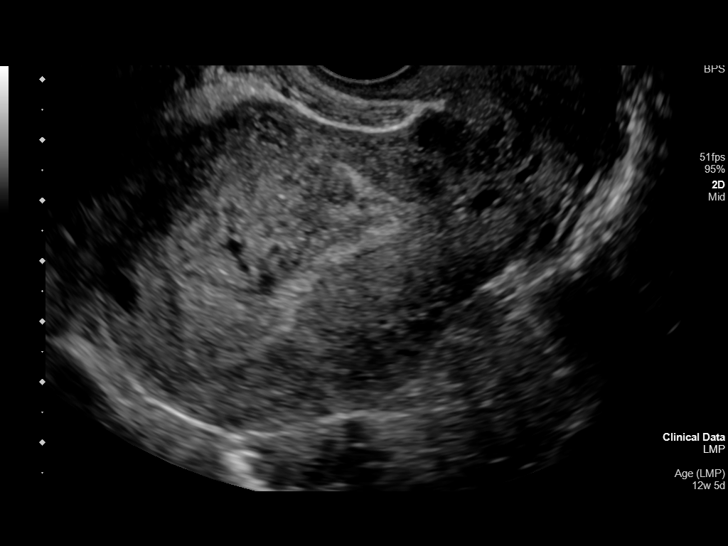
[im 60/115]
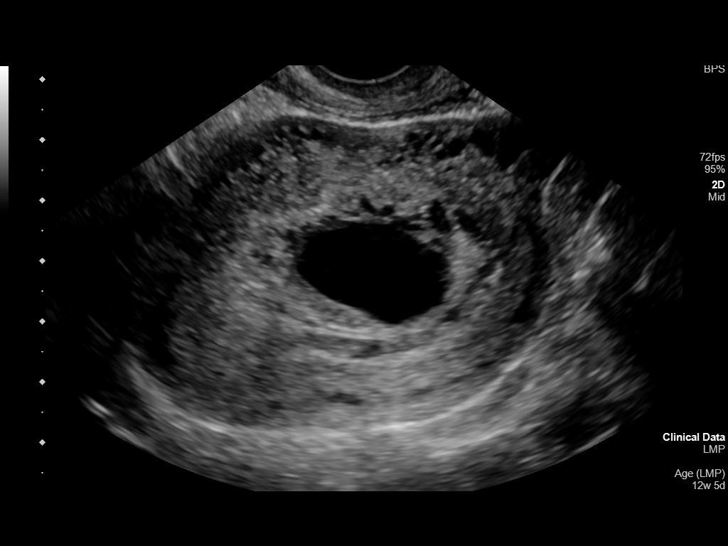
[im 68/115]
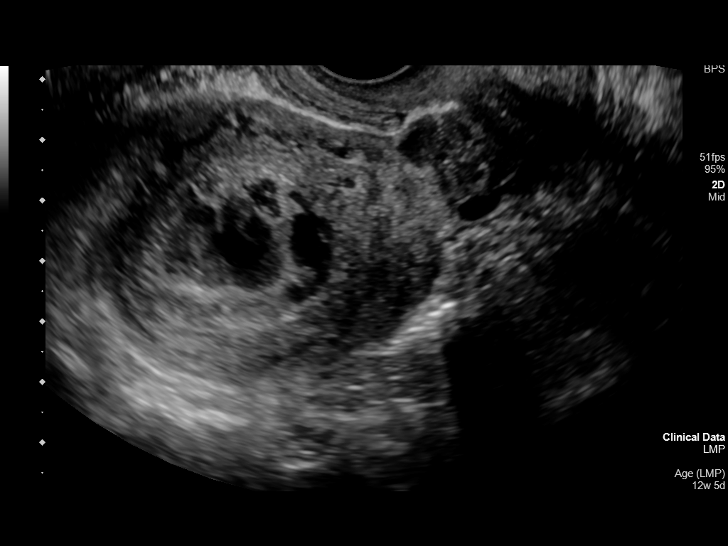
[im 77/115]
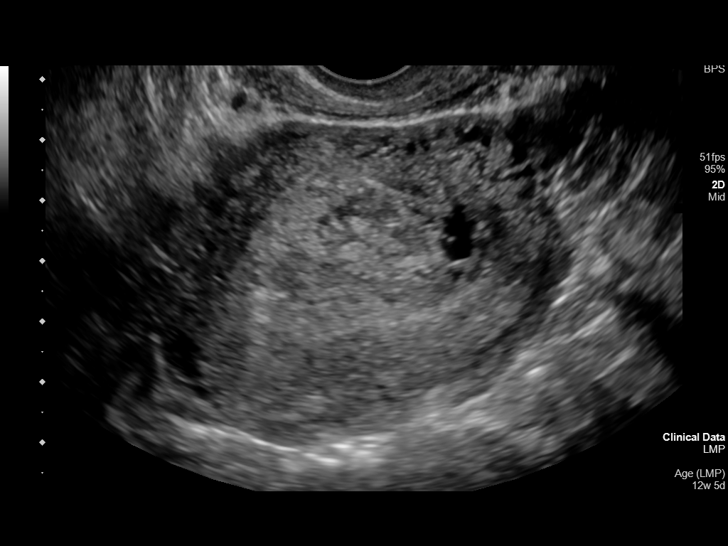
[im 85/115]
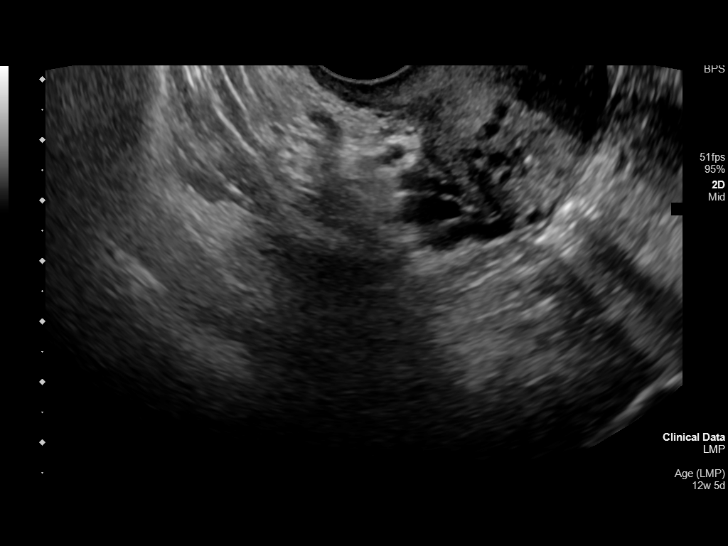
[im 93/115]
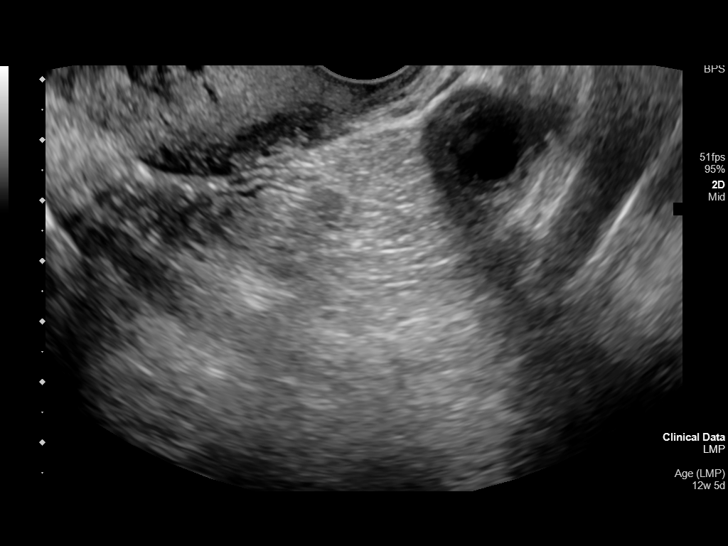
[im 102/115]
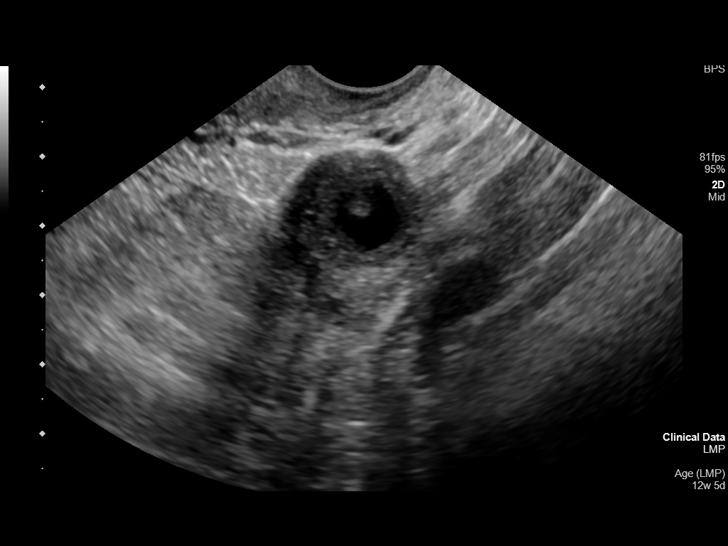
[im 110/115]
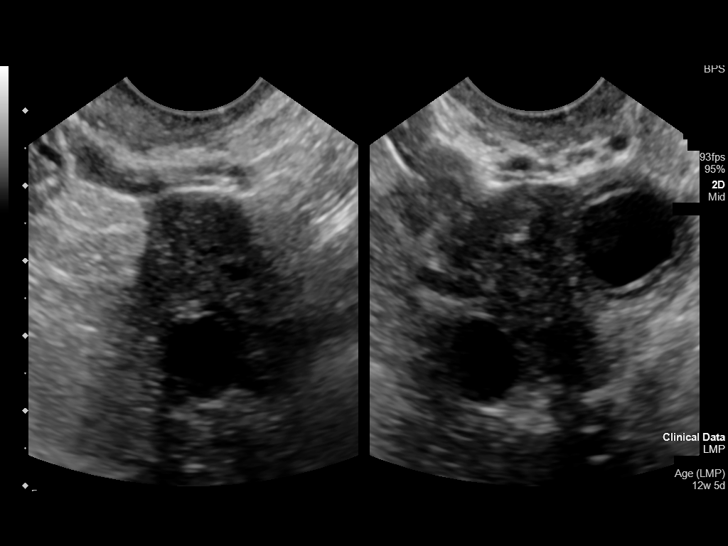

[13 of 28 positions shown; findings below may reference images not displayed]

FINDINGS: Intrauterine gestational sac: Visualized.

Yolk sac:  Not visualized.

Embryo:  Not visualized.

Cardiac Activity: Not visualized.

MSD: 19 mm   6 w   6 d

Subchorionic hemorrhage:  None visualized.

Maternal uterus/adnexae: Probable small corpus luteum on the left
measuring up to 16 mm. No suspicious adnexal findings. The right
ovary is not visualized. No significant free pelvic fluid.
IMPRESSION: Intrauterine gestational sac without visible embryo or suspicious
adnexal findings. Findings are suspicious but not yet definitive for
failed pregnancy. Recommend follow-up US in 10-14 days for
definitive diagnosis. This recommendation follows SRU consensus
guidelines: Diagnostic Criteria for Nonviable Pregnancy Early in the
First Trimester. N Engl J Med 8019; [DATE].

## 2022-03-08 ENCOUNTER — Encounter: Payer: Self-pay | Admitting: Emergency Medicine

## 2022-03-08 ENCOUNTER — Other Ambulatory Visit: Payer: Self-pay

## 2022-03-08 DIAGNOSIS — N9489 Other specified conditions associated with female genital organs and menstrual cycle: Secondary | ICD-10-CM | POA: Diagnosis not present

## 2022-03-08 DIAGNOSIS — Z3A17 17 weeks gestation of pregnancy: Secondary | ICD-10-CM | POA: Diagnosis not present

## 2022-03-08 DIAGNOSIS — O132 Gestational [pregnancy-induced] hypertension without significant proteinuria, second trimester: Secondary | ICD-10-CM | POA: Insufficient documentation

## 2022-03-08 DIAGNOSIS — R000 Tachycardia, unspecified: Secondary | ICD-10-CM | POA: Diagnosis not present

## 2022-03-08 DIAGNOSIS — D72829 Elevated white blood cell count, unspecified: Secondary | ICD-10-CM | POA: Diagnosis not present

## 2022-03-08 DIAGNOSIS — E860 Dehydration: Secondary | ICD-10-CM | POA: Insufficient documentation

## 2022-03-08 DIAGNOSIS — F419 Anxiety disorder, unspecified: Secondary | ICD-10-CM | POA: Diagnosis not present

## 2022-03-08 LAB — CBC
HCT: 42.6 % (ref 36.0–46.0)
Hemoglobin: 14.5 g/dL (ref 12.0–15.0)
MCH: 29.1 pg (ref 26.0–34.0)
MCHC: 34 g/dL (ref 30.0–36.0)
MCV: 85.5 fL (ref 80.0–100.0)
Platelets: 253 10*3/uL (ref 150–400)
RBC: 4.98 MIL/uL (ref 3.87–5.11)
RDW: 12.4 % (ref 11.5–15.5)
WBC: 14.4 10*3/uL — ABNORMAL HIGH (ref 4.0–10.5)
nRBC: 0 % (ref 0.0–0.2)

## 2022-03-08 LAB — COMPREHENSIVE METABOLIC PANEL
ALT: 16 U/L (ref 0–44)
AST: 21 U/L (ref 15–41)
Albumin: 3.8 g/dL (ref 3.5–5.0)
Alkaline Phosphatase: 45 U/L (ref 38–126)
Anion gap: 8 (ref 5–15)
BUN: 9 mg/dL (ref 6–20)
CO2: 23 mmol/L (ref 22–32)
Calcium: 9.8 mg/dL (ref 8.9–10.3)
Chloride: 104 mmol/L (ref 98–111)
Creatinine, Ser: 0.48 mg/dL (ref 0.44–1.00)
GFR, Estimated: >60 mL/min (ref >60)
Glucose, Bld: 169 mg/dL — ABNORMAL HIGH (ref 70–99)
Potassium: 3.6 mmol/L (ref 3.5–5.1)
Sodium: 135 mmol/L (ref 135–145)
Total Bilirubin: 0.6 mg/dL (ref 0.3–1.2)
Total Protein: 8 g/dL (ref 6.5–8.1)

## 2022-03-08 LAB — URINALYSIS, ROUTINE W REFLEX MICROSCOPIC
Bacteria, UA: NONE SEEN
Bilirubin Urine: NEGATIVE
Glucose, UA: 50 mg/dL — AB
Hgb urine dipstick: NEGATIVE
Ketones, ur: 20 mg/dL — AB
Leukocytes,Ua: NEGATIVE
Nitrite: NEGATIVE
Protein, ur: 100 mg/dL — AB
Specific Gravity, Urine: 1.017 (ref 1.005–1.030)
pH: 6 (ref 5.0–8.0)

## 2022-03-08 LAB — ABO/RH: ABO/RH(D): O NEG

## 2022-03-08 LAB — HCG, QUANTITATIVE, PREGNANCY: hCG, Beta Chain, Quant, S: 87511 m[IU]/mL — ABNORMAL HIGH (ref <5)

## 2022-03-08 LAB — POC URINE PREG, ED: Preg Test, Ur: POSITIVE — AB

## 2022-03-08 LAB — MAGNESIUM: Magnesium: 2 mg/dL (ref 1.7–2.4)

## 2022-03-08 NOTE — ED Triage Notes (Signed)
Pt presents to ER with c/o high blood pressure and has been feeling more stressed and anxious today.  Pt also endorses some HA with migraine features, which pt states she has hx of.  Pt states she is 16 weeks, 6 days pregnant by her calculation and has been unable to establish prenatal care for this pregnancy.  Pt is a G3P1.  Pt otherwise A&O x4 at this time in NAD in triage.

## 2022-03-08 NOTE — ED Provider Triage Note (Signed)
  Emergency Medicine Provider Triage Evaluation Note  Meghan Lowe , a 41 y.o.female,  was evaluated in triage.  Pt complains of  Headache and feeling anxious for the past week.  She is [redacted] weeks pregnant.  She has a history of headache with migraine features.  She is additionally been having high blood pressure as well.  Review of Systems  Positive: Headache, anxiety, hypertension Negative: Denies fever, chest pain, vomiting  Physical Exam   Vitals:   03/08/22 2047  BP: (!) 171/94  Pulse: (!) 114  Resp: 18  Temp: 98.8 F (37.1 C)  SpO2: 99%   Gen:   Awake, appears nervous Resp:  Normal effort  MSK:   Moves extremities without difficulty  Other:    Medical Decision Making  Given the patient's initial medical screening exam, the following diagnostic evaluation has been ordered. The patient will be placed in the appropriate treatment space, once one is available, to complete the evaluation and treatment. I have discussed the plan of care with the patient and I have advised the patient that an ED physician or mid-level practitioner will reevaluate their condition after the test results have been received, as the results may give them additional insight into the type of treatment they may need.    Diagnostics: Labs, EKG, UA  Treatments: none immediately   Varney Daily, [place] 03/08/22 2114

## 2022-03-09 ENCOUNTER — Emergency Department
Admission: EM | Admit: 2022-03-09 | Discharge: 2022-03-09 | Disposition: A | Discharge status: Home / Self Care | Payer: Medicaid Other | Attending: Emergency Medicine | Admitting: Emergency Medicine

## 2022-03-09 DIAGNOSIS — I10 Essential (primary) hypertension: Secondary | ICD-10-CM

## 2022-03-09 DIAGNOSIS — Z3492 Encounter for supervision of normal pregnancy, unspecified, second trimester: Secondary | ICD-10-CM

## 2022-03-09 DIAGNOSIS — F419 Anxiety disorder, unspecified: Secondary | ICD-10-CM

## 2022-03-09 DIAGNOSIS — E860 Dehydration: Secondary | ICD-10-CM

## 2022-03-09 MED ORDER — SODIUM CHLORIDE 0.9 % IV BOLUS
500.0000 mL | Freq: Once | INTRAVENOUS | Status: AC
  Administered 2022-03-09: 500 mL via INTRAVENOUS

## 2022-03-09 MED ORDER — DIAZEPAM 2 MG PO TABS: 2.0000 mg | ORAL_TABLET | Freq: Three times a day (TID) | ORAL | 0 refills | Status: AC | PRN

## 2022-03-09 MED ORDER — LABETALOL HCL 5 MG/ML IV SOLN
20.0000 mg | Freq: Once | INTRAVENOUS | Status: AC
  Administered 2022-03-09: 20 mg via INTRAVENOUS
  Filled 2022-03-09: qty 4

## 2022-03-09 MED ORDER — DIAZEPAM 2 MG PO TABS
2.0000 mg | ORAL_TABLET | Freq: Once | ORAL | Status: AC
  Administered 2022-03-09: 2 mg via ORAL
  Filled 2022-03-09: qty 1

## 2022-03-09 MED ORDER — NIFEDIPINE ER OSMOTIC RELEASE 30 MG PO TB24: 30.0000 mg | ORAL_TABLET | Freq: Every day | ORAL | 0 refills | Status: AC

## 2022-03-09 NOTE — Discharge Instructions (Addendum)
1.  Start Procardia daily for blood pressure control. 2.  Take Valium sparingly as needed for anxiety. 3.  Return to the ER for worsening symptoms, persistent vomiting, abdominal pain, difficulty breathing or other concerns.

## 2022-03-09 NOTE — ED Provider Notes (Signed)
[facility] Provider Note    Event Date/Time   First MD Initiated Contact with Patient 03/09/22 [phone]     (approximate)   History   Hypertension and Anxiety   HPI  Meghan Lowe is a 41 y.o. female presents to the ED from home with a chief complaint of anxiety, stress and high blood pressure.  Endorses headache with migraine features which patient has a history of.  Patient is G3, P1 approximately [redacted] weeks pregnant by dates.  Has been unable to establish prenatal care because she wanted to go back to [facility] and was told they are no longer taking Medicaid.  Denies vision changes, chest pain, shortness of breath, abdominal pain, nausea, vomiting, dysuria or vaginal bleeding.     Past Medical History   Past Medical History:  Diagnosis Date   Anxiety    Gestational diabetes    Headache    mirgraines started around the age of [age] years old   Hypertension      Active Problem List   Patient Active Problem List   Diagnosis Date Noted   Incomplete abortion 09/29/2019   Hypertension affecting pregnancy in third trimester 04/19/2016   Pregnancy complicated by fetal cerebral ventriculomegaly 04/12/2016   Chronic hypertension in pregnancy 03/22/2016   Pregnancy complicated by fetal cerebral ventriculomegaly, single gestation 03/22/2016   Gestational diabetes 03/22/2016   Hypertension in pregnancy, antepartum 03/22/2016     Past Surgical History   Past Surgical History:  Procedure Laterality Date   CESAREAN SECTION N/A 04/22/2016   Procedure: CESAREAN SECTION;  Surgeon: Elenora Fender Ward, MD;  Location: [facility] ORS;  Service: Obstetrics;  Laterality: N/A;   NO PAST SURGERIES       Home Medications   Prior to Admission medications   Medication Sig Start Date End Date Taking? Authorizing Provider  diazepam (VALIUM) 2 MG tablet Take 1 tablet (2 mg total) by mouth every 8 (eight) hours as needed for muscle spasms. 03/09/22  Yes Irean Hong, MD   NIFEdipine (PROCARDIA-XL/NIFEDICAL-XL) 30 MG 24 hr tablet Take 1 tablet (30 mg total) by mouth daily. 03/09/22 03/09/23 Yes Irean Hong, MD  methylergonovine (METHERGINE) 0.2 MG tablet Take 1 tablet (0.2 mg total) by mouth every 6 (six) hours. 09/29/19   Ward, Elenora Fender, MD     Allergies  Fish allergy and Sulfa antibiotics   Family History   Family History  Problem Relation Age of Onset   Hypertension Mother    Neurologic Disorder Mother    Diabetes Father    Heart attack Paternal Grandmother      Physical Exam  Triage Vital Signs: ED Triage Vitals  Enc Vitals Group     BP 03/08/22 2047 (!) 171/94     Pulse Rate 03/08/22 2047 (!) 114     Resp 03/08/22 2047 18     Temp 03/08/22 2047 98.8 F (37.1 C)     Temp Source 03/08/22 2047 Oral     SpO2 03/08/22 2047 99 %     Weight 03/08/22 2048 198 lb (89.8 kg)     Height 03/08/22 2048 5\' 7"  (1.702 m)     Head Circumference --      Peak Flow --      Pain Score 03/08/22 2053 2     Pain Loc --      Pain Edu? --      Excl. in GC? --     Updated Vital Signs: BP 129/84   Pulse  96   Temp 98.1 F (36.7 C) (Oral)   Resp 15   Ht 5\' 7"  (1.702 m)   Wt 89.8 kg   LMP 11/10/2021 (Exact Date)   SpO2 100%   BMI 31.01 kg/m    General: Awake, mild distress.  CV:  Tachycardic.  Good peripheral perfusion.  Resp:  Normal effort.  CTAB. Abd:  Nontender to light or deep palpation.  No distention.  Other:  Alert and oriented x 3.  CN II-XII intact.  No carotid bruits.  Supple neck without meningismus.  5/5 motor strength and sensation all extremities.  Bilateral calves are nontender and not  swollen.  Highly anxious.   ED Results / Procedures / Treatments  Labs (all labs ordered are listed, but only abnormal results are displayed) Labs Reviewed  CBC - Abnormal; Notable for the following components:      Result Value   WBC 14.4 (*)    All other components within normal limits  COMPREHENSIVE METABOLIC PANEL - Abnormal; Notable for the  following components:   Glucose, Bld 169 (*)    All other components within normal limits  URINALYSIS, ROUTINE W REFLEX MICROSCOPIC - Abnormal; Notable for the following components:   Color, Urine YELLOW (*)    APPearance HAZY (*)    Glucose, UA 50 (*)    Ketones, ur 20 (*)    Protein, ur 100 (*)    All other components within normal limits  HCG, QUANTITATIVE, PREGNANCY - Abnormal; Notable for the following components:   hCG, Beta Chain, Quant, S 87,511 (*)    All other components within normal limits  POC URINE PREG, ED - Abnormal; Notable for the following components:   Preg Test, Ur Positive (*)    All other components within normal limits  MAGNESIUM  ABO/RH     EKG  None   RADIOLOGY None   Official radiology report(s): No results found.   PROCEDURES:  Critical Care performed: No  .1-3 Lead EKG Interpretation  Performed by: 01/10/2022, MD Authorized by: Irean Hong, MD     Interpretation: abnormal     ECG rate:  110   ECG rate assessment: tachycardic     Rhythm: sinus tachycardia     Ectopy: none     Conduction: normal   Comments:     Patient placed on cardiac monitor to evaluate for arrhythmias    MEDICATIONS ORDERED IN ED: Medications  sodium chloride 0.9 % bolus 500 mL (0 mLs Intravenous Stopped 03/09/22 0406)  labetalol (NORMODYNE) injection 20 mg (20 mg Intravenous Given 03/09/22 0304)  diazepam (VALIUM) tablet 2 mg (2 mg Oral Given 03/09/22 0306)     IMPRESSION / MDM / ASSESSMENT AND PLAN / ED COURSE  I reviewed the triage vital signs and the nursing notes.                             41 year old female approximately [redacted] weeks pregnant by dates presenting with increased stress/anxiety, elevated blood pressure.  Diagnosis includes but is not limited to pregnancy-induced hypertension, generalized anxiety disorder, metabolic, infectious etiologies, etc. I have personally reviewed patient's records and note she had a miscarriage in February  2021.  Patient's presentation is most consistent with acute presentation with potential threat to life or bodily function.  The patient is on the cardiac monitor to evaluate for evidence of arrhythmia and/or significant heart rate changes.  Laboratory results demonstrate moderate leukocytosis WBC  14.4, normal electrolytes, ketonuria.  Fetal heart tones obtained.  Will administer IV hydration, IV labetalol for blood pressure control.  Patient very insistent on receiving something for her anxiety.  We discussed that long-term use of anxiolytic is not good for pregnancy but will administer Valium tablets tonight.  Will reassess  Clinical Course as of 03/09/22 0407  Tue Mar 09, 2022  0406 Heart rate 88, blood pressure 120/80.  Patient feeling significantly better.  Will discharge home with prescription for Procardia daily and as needed Valium to be used sparingly.  Patient will try to establish OB care at [facility].  Strict return precautions given.  Patient and family member verbalized understanding agree with plan of care. [JS]    Clinical Course User Index [JS] Irean Hong, MD     FINAL CLINICAL IMPRESSION(S) / ED DIAGNOSES   Final diagnoses:  Hypertension, unspecified type  Second trimester pregnancy  Anxiety  Dehydration     Rx / DC Orders   ED Discharge Orders          Ordered    NIFEdipine (PROCARDIA-XL/NIFEDICAL-XL) 30 MG 24 hr tablet  Daily        03/09/22 0236    diazepam (VALIUM) 2 MG tablet  Every 8 hours PRN        03/09/22 0236             Note:  This document was prepared using Dragon voice recognition software and may include unintentional dictation errors.   Irean Hong, MD 03/09/22 [phone]

## 2022-03-25 ENCOUNTER — Ambulatory Visit (INDEPENDENT_AMBULATORY_CARE_PROVIDER_SITE_OTHER): Payer: Medicaid Other

## 2022-03-25 ENCOUNTER — Ambulatory Visit (INDEPENDENT_AMBULATORY_CARE_PROVIDER_SITE_OTHER): Payer: Medicaid Other | Admitting: Obstetrics and Gynecology

## 2022-03-25 ENCOUNTER — Encounter: Payer: Self-pay | Admitting: Obstetrics and Gynecology

## 2022-03-25 VITALS — BP 147/92 | HR 103 | Ht 67.0 in | Wt 201.5 lb

## 2022-03-25 DIAGNOSIS — Z113 Encounter for screening for infections with a predominantly sexual mode of transmission: Secondary | ICD-10-CM | POA: Diagnosis not present

## 2022-03-25 DIAGNOSIS — Z3A19 19 weeks gestation of pregnancy: Secondary | ICD-10-CM | POA: Diagnosis not present

## 2022-03-25 DIAGNOSIS — Z7689 Persons encountering health services in other specified circumstances: Secondary | ICD-10-CM | POA: Diagnosis not present

## 2022-03-25 DIAGNOSIS — Z1379 Encounter for other screening for genetic and chromosomal anomalies: Secondary | ICD-10-CM

## 2022-03-25 DIAGNOSIS — O0992 Supervision of high risk pregnancy, unspecified, second trimester: Secondary | ICD-10-CM | POA: Diagnosis not present

## 2022-03-25 NOTE — Progress Notes (Signed)
HPI:      [name] is a 41 y.o. G3P1011 who LMP was Patient's last menstrual period was 11/08/2021 (exact date).  Subjective:   She presents today after being seen in the emergency department and found to be approximately 19 weeks estimated gestational age based on LMP.  Patient states that she thinks she is feeling fetal movement.  She has discontinued prenatal vitamins because they were making her sick.  She has a history of prior cesarean delivery after failed induction for chronic hypertension.  She was placed on Valium for stress and Procardia in the emergency department.  She continues to take the Valium as needed but has discontinued the Procardia because she states that her blood pressure became too low and she was symptomatic. She has not decided regarding TOLAC versus repeat cesarean delivery but she seems to be leaning toward repeat cesarean delivery.    Hx: The following portions of the patient's history were reviewed and updated as appropriate:             She  has a past medical history of Anxiety, Gestational diabetes, Headache, and Hypertension. She does not have any pertinent problems on file. She  has a past surgical history that includes No past surgeries and Cesarean section (N/A, 04/22/2016). Her family history includes Diabetes in her father; Heart attack in her paternal grandmother; Hypertension in her mother; Neurologic Disorder in her mother. She  reports that she has never smoked. She has never used smokeless tobacco. She reports that she does not drink alcohol and does not use drugs. She has a current medication list which includes the following prescription(s): diazepam, multivitamin-prenatal, and nifedipine. She is allergic to fish allergy and sulfa antibiotics.       Review of Systems:  Review of Systems  Constitutional: Denied constitutional symptoms, night sweats, recent illness, fatigue, fever, insomnia and weight loss.  Eyes: Denied eye symptoms, eye pain,  photophobia, vision change and visual disturbance.  Ears/Nose/Throat/Neck: Denied ear, nose, throat or neck symptoms, hearing loss, nasal discharge, sinus congestion and sore throat.  Cardiovascular: Denied cardiovascular symptoms, arrhythmia, chest pain/pressure, edema, exercise intolerance, orthopnea and palpitations.  Respiratory: Denied pulmonary symptoms, asthma, pleuritic pain, productive sputum, cough, dyspnea and wheezing.  Gastrointestinal: Denied, gastro-esophageal reflux, melena, nausea and vomiting.  Genitourinary: Denied genitourinary symptoms including symptomatic vaginal discharge, pelvic relaxation issues, and urinary complaints.  Musculoskeletal: Denied musculoskeletal symptoms, stiffness, swelling, muscle weakness and myalgia.  Dermatologic: Denied dermatology symptoms, rash and scar.  Neurologic: Denied neurology symptoms, dizziness, headache, neck pain and syncope.  Psychiatric: Denied psychiatric symptoms, anxiety and depression.  Endocrine: Denied endocrine symptoms including hot flashes and night sweats.   Meds:   Current Outpatient Medications on File Prior to Visit  Medication Sig Dispense Refill   diazepam (VALIUM) 2 MG tablet Take 1 tablet (2 mg total) by mouth every 8 (eight) hours as needed for muscle spasms. 15 tablet 0   Prenatal Vit-Fe Fumarate-FA (MULTIVITAMIN-PRENATAL) 27-0.8 MG TABS tablet Take 1 tablet by mouth daily at 12 noon.     NIFEdipine (PROCARDIA-XL/NIFEDICAL-XL) 30 MG 24 hr tablet Take 1 tablet (30 mg total) by mouth daily. (Patient not taking: Reported on 03/25/2022) 30 tablet 0   No current facility-administered medications on file prior to visit.      Objective:     Vitals:   03/25/22 0824 03/25/22 0845  BP: (!) 154/99 (!) 147/92  Pulse: (!) 114 (!) 103   Filed Weights   03/25/22 0824  Weight: 201  lb 8 oz (91.4 kg)              No fetal heart tones dopplered.  Abdominal palpation does not coincide with 19 weeks estimated gestational  age pregnancy.          Assessment:    G3P1011 Patient Active Problem List   Diagnosis Date Noted   Incomplete abortion 09/29/2019   Hypertension affecting pregnancy in third trimester 04/19/2016   Pregnancy complicated by fetal cerebral ventriculomegaly 04/12/2016   Chronic hypertension in pregnancy 03/22/2016   Pregnancy complicated by fetal cerebral ventriculomegaly, single gestation 03/22/2016   Gestational diabetes 03/22/2016   Hypertension in pregnancy, antepartum 03/22/2016     1. Establishing care with new doctor, encounter for   2. High-risk pregnancy supervision, second trimester   3. [redacted] weeks gestation of pregnancy   4. Screening for STD (sexually transmitted disease)   5. Genetic screening        Plan:            1.  Ultrasound for size dates and viability  2.  Blood work for prenatal labs  3.  Urine for UDS and UA and C&S  4.  Follow-up for nurse visit and new OB visit to include Pap smear.  5.  Close follow-up of blood pressure -this is what complicated because the patient states that her blood pressures are always normal at home and when she comes to the doctor's office they are elevated.  If she continues to have elevated blood pressure strongly consider labetalol as she has failed Procardia.  Orders Orders Placed This Encounter  Procedures   Culture, OB Urine   GC/Chlamydia Probe Amp   ABO AND RH    Hemoglobin A1c   Hemoglobin Solubility w/ Rflx to Frac   HIV Antibody (routine testing w rflx)   Nicotine screen, urine   Varicella zoster antibody, IgG   Viral Hepatitis HBV, HCV   Urinalysis, Routine w reflex microscopic   Toxoplasma antibodies- IgG and  IgM   Rubella screen   RPR   Pain Mgt Scrn (14 Drugs), Ur   MaterniT21  plus Core+ESS+SCA, Blood   AFP, Serum, Open Spina Bifida   Antibody screen    No orders of the defined types were placed in this encounter.     F/U  Return in about 3 weeks (around 04/15/2022). I spent 42 minutes involved  in the care of this patient preparing to see the patient by obtaining and reviewing her medical history (including labs, imaging tests and prior procedures), documenting clinical information in the electronic health record (EHR), counseling and coordinating care plans, writing and sending prescriptions, ordering tests or procedures and in direct communicating with the patient and medical staff discussing pertinent items from her history and physical exam.  Elonda Husky, M.D. 03/25/2022 9:30 AM

## 2022-03-25 NOTE — Progress Notes (Signed)
Patient presents today for a ED follow-up and to establish prenatal care. Per patients LMP of 11/08/21 she is [redacted]w[redacted]d. She states she is no longer taking Procardia, states it made her feel like she was crashing, continues to take Valium as needed. No other concerns at this time.

## 2022-03-25 NOTE — Addendum Note (Signed)
Addended by: Loman Chroman on: 03/25/2022 10:13 AM   Modules accepted: Orders

## 2022-03-26 ENCOUNTER — Ambulatory Visit (INDEPENDENT_AMBULATORY_CARE_PROVIDER_SITE_OTHER): Payer: Medicaid Other

## 2022-03-26 DIAGNOSIS — Z3A00 Weeks of gestation of pregnancy not specified: Secondary | ICD-10-CM

## 2022-03-26 DIAGNOSIS — Z3480 Encounter for supervision of other normal pregnancy, unspecified trimester: Secondary | ICD-10-CM | POA: Insufficient documentation

## 2022-03-26 DIAGNOSIS — O09529 Supervision of elderly multigravida, unspecified trimester: Secondary | ICD-10-CM

## 2022-03-26 LAB — MICROSCOPIC EXAMINATION
Bacteria, UA: NONE SEEN
Casts: NONE SEEN /lpf
RBC, Urine: NONE SEEN /hpf (ref 0–2)

## 2022-03-26 LAB — URINALYSIS, ROUTINE W REFLEX MICROSCOPIC
Bilirubin, UA: NEGATIVE
Leukocytes,UA: NEGATIVE
Nitrite, UA: NEGATIVE
RBC, UA: NEGATIVE
Specific Gravity, UA: 1.023 (ref 1.005–1.030)
Urobilinogen, Ur: 0.2 mg/dL (ref 0.2–1.0)
pH, UA: 6 (ref 5.0–7.5)

## 2022-03-26 NOTE — Progress Notes (Signed)
New OB Intake  I connected with  Meghan Lowe on 03/26/22 at  3:15 PM EDT by telephone and verified that I am speaking with the correct person using two identifiers. Nurse is located at [facility] and pt is located at home.  I explained I am completing New OB Intake today. We discussed her EDD of 08/15/2022 that is based on LMP of 11/08/2021. U/S does not agree.  Pt is G3/P1011. I reviewed her allergies, medications, Medical/Surgical/OB history, and appropriate screenings. Based on history, this is a/an pregnancy uncomplicated .   Patient Active Problem List   Diagnosis Date Noted   Supervision of other normal pregnancy, antepartum 03/26/2022   Incomplete abortion 09/29/2019   Hypertension affecting pregnancy in third trimester 04/19/2016   Pregnancy complicated by fetal cerebral ventriculomegaly 04/12/2016   Chronic hypertension in pregnancy 03/22/2016   Pregnancy complicated by fetal cerebral ventriculomegaly, single gestation 03/22/2016   Gestational diabetes 03/22/2016   Hypertension in pregnancy, antepartum 03/22/2016    Concerns addressed today None  Delivery Plans:  Plans to deliver at [facility] - undecided.  Anatomy US Explained first scheduled [place] will be around 20 weeks. Anatomy US scheduled for 03/30/2022 at 8am.   Labs NOB Labs and genetic testing already drawn; pt aware results are not back yet.  COVID Vaccine Patient has not had COVID vaccine.   Social Determinants of Health Food Insecurity: denies food insecurity Transportation: Patient denies transportation needs. Pt states she doesn't drive d/t her anxiety. Childcare: Discussed no children allowed at ultrasound appointments.   First visit review I reviewed new OB appt with pt. I explained she will have ob bloodwork and pap smear/pelvic exam if indicated. Explained pt will be seen by Dr. Logan Bores at first visit; encounter routed to appropriate provider.   [name], [facility] 03/26/2022  3:35 PM   Clinical Staff Provider  Office Location  [facility] Dating    Language  English Anatomy US    Flu Vaccine  offer Genetic Screen  NIPS:   TDaP vaccine   offer Hgb A1C or  GTT Early : Third trimester :   Covid declines   LAB RESULTS   Rhogam  Will need Blood Type --/--/O NEG Performed at [facility], [address] [place], [place] [address]  [phone])   Feeding Plan breast Antibody    Contraception Undecided ? tubal Rubella    Circumcision It's a girl RPR     Pediatrician  [facility] [place] [facility]     Support Person [place] HIV    Prenatal Classes no Varicella     GBS  (For PCN allergy, check sensitivities)   BTL Consent  Hep C   VBAC Consent  Pap      Hgb Electro      CF      SMA

## 2022-03-28 LAB — GC/CHLAMYDIA PROBE AMP
Chlamydia trachomatis, NAA: NEGATIVE
Neisseria Gonorrhoeae by PCR: NEGATIVE

## 2022-03-29 LAB — MATERNIT21  PLUS CORE+ESS+SCA, BLOOD
11q23 deletion (Jacobsen): NOT DETECTED
15q11 deletion (PW Angelman): NOT DETECTED
1p36 deletion syndrome: NOT DETECTED
22q11 deletion (DiGeorge): NOT DETECTED
4p16 deletion(Wolf-Hirschhorn): NOT DETECTED
5p15 deletion (Cri-du-chat): NOT DETECTED
8q24 deletion (Langer-Giedion): NOT DETECTED
Fetal Fraction: 3
Monosomy X (Turner Syndrome): NOT DETECTED
Result (T21): NEGATIVE
Trisomy 13 (Patau syndrome): NEGATIVE
Trisomy 16: NOT DETECTED
Trisomy 18 (Edwards syndrome): NEGATIVE
Trisomy 21 (Down syndrome): NEGATIVE
Trisomy 22: NOT DETECTED
XXX (Triple X Syndrome): NOT DETECTED
XXY (Klinefelter Syndrome): NOT DETECTED
XYY (Jacobs Syndrome): NOT DETECTED

## 2022-03-30 ENCOUNTER — Other Ambulatory Visit: Payer: Medicaid Other

## 2022-03-30 LAB — URINE CULTURE, OB REFLEX

## 2022-03-30 LAB — CULTURE, OB URINE

## 2022-03-31 LAB — AFP, SERUM, OPEN SPINA BIFIDA
AFP MoM: 0.68
AFP Value: 31.5 ng/mL
Gest. Age on Collection Date: 19.6 weeks
Maternal Age At EDD: 42 yr
OSBR Risk 1 IN: 10000
Test Results:: NEGATIVE
Weight: 201 [lb_av]

## 2022-03-31 LAB — ABO AND RH: Rh Factor: NEGATIVE

## 2022-03-31 LAB — RPR: RPR Ser Ql: NONREACTIVE

## 2022-03-31 LAB — TOXOPLASMA ANTIBODIES- IGG AND  IGM
Toxoplasma Antibody- IgM: <3 AU/mL (ref 0.0–7.9)
Toxoplasma IgG Ratio: <3 IU/mL (ref 0.0–7.1)

## 2022-03-31 LAB — VIRAL HEPATITIS HBV, HCV
HCV Ab: NONREACTIVE
Hep B Core Total Ab: NEGATIVE
Hep B Surface Ab, Qual: NONREACTIVE
Hepatitis B Surface Ag: NEGATIVE

## 2022-03-31 LAB — RUBELLA SCREEN: Rubella Antibodies, IGG: 2.13 index (ref >0.99)

## 2022-03-31 LAB — HCV INTERPRETATION

## 2022-03-31 LAB — VARICELLA ZOSTER ANTIBODY, IGG: Varicella zoster IgG: 229 index (ref >165)

## 2022-03-31 LAB — HIV ANTIBODY (ROUTINE TESTING W REFLEX): HIV Screen 4th Generation wRfx: NONREACTIVE

## 2022-03-31 LAB — ANTIBODY SCREEN: Antibody Screen: NEGATIVE

## 2022-03-31 LAB — HGB SOLU + RFLX FRAC: Sickle Solubility Test - HGBRFX: NEGATIVE

## 2022-04-05 NOTE — Progress Notes (Signed)
Please make sure she has been treated for this UTI - GBS bacturia.

## 2022-04-06 ENCOUNTER — Other Ambulatory Visit: Payer: Self-pay

## 2022-04-06 DIAGNOSIS — B951 Streptococcus, group B, as the cause of diseases classified elsewhere: Secondary | ICD-10-CM

## 2022-04-06 MED ORDER — NITROFURANTOIN MONOHYD MACRO 100 MG PO CAPS: 100.0000 mg | ORAL_CAPSULE | Freq: Two times a day (BID) | ORAL | 0 refills | Status: AC

## 2022-04-09 LAB — PAIN MGT SCRN (14 DRUGS), UR

## 2022-04-09 LAB — NICOTINE SCREEN, URINE

## 2022-04-12 ENCOUNTER — Other Ambulatory Visit: Payer: Self-pay | Admitting: Obstetrics and Gynecology

## 2022-04-12 DIAGNOSIS — Z363 Encounter for antenatal screening for malformations: Secondary | ICD-10-CM

## 2022-04-14 ENCOUNTER — Ambulatory Visit (INDEPENDENT_AMBULATORY_CARE_PROVIDER_SITE_OTHER): Payer: Medicaid Other

## 2022-04-14 DIAGNOSIS — Z363 Encounter for antenatal screening for malformations: Secondary | ICD-10-CM

## 2022-04-20 ENCOUNTER — Encounter: Payer: Medicaid Other | Admitting: Obstetrics and Gynecology

## 2022-04-20 ENCOUNTER — Telehealth: Payer: Self-pay | Admitting: Obstetrics and Gynecology

## 2022-04-20 DIAGNOSIS — Z3482 Encounter for supervision of other normal pregnancy, second trimester: Secondary | ICD-10-CM

## 2022-04-20 DIAGNOSIS — Z3A22 22 weeks gestation of pregnancy: Secondary | ICD-10-CM

## 2022-04-20 NOTE — Telephone Encounter (Signed)
Reached out to patient via telephone call to schedule for NOB physical. No answer, Left VM for patient to call office at earliest convenience to discuss further prenatal care.

## 2022-06-17 DIAGNOSIS — Z98891 History of uterine scar from previous surgery: Secondary | ICD-10-CM | POA: Insufficient documentation

## 2022-07-06 DIAGNOSIS — R202 Paresthesia of skin: Secondary | ICD-10-CM | POA: Insufficient documentation

## 2022-07-06 DIAGNOSIS — J45909 Unspecified asthma, uncomplicated: Secondary | ICD-10-CM | POA: Insufficient documentation

## 2022-07-06 DIAGNOSIS — Q159 Congenital malformation of eye, unspecified: Secondary | ICD-10-CM | POA: Insufficient documentation

## 2022-07-18 DIAGNOSIS — O1413 Severe pre-eclampsia, third trimester: Secondary | ICD-10-CM | POA: Insufficient documentation

## 2022-07-18 DIAGNOSIS — Z9851 Tubal ligation status: Secondary | ICD-10-CM | POA: Insufficient documentation

## 2024-06-20 ENCOUNTER — Ambulatory Visit
Admission: EM | Admit: 2024-06-20 | Discharge: 2024-06-20 | Disposition: A | Discharge status: Home / Self Care | Payer: Self-pay | Attending: Emergency Medicine | Admitting: Emergency Medicine

## 2024-06-20 DIAGNOSIS — Z8632 Personal history of gestational diabetes: Secondary | ICD-10-CM | POA: Insufficient documentation

## 2024-06-20 DIAGNOSIS — R739 Hyperglycemia, unspecified: Secondary | ICD-10-CM | POA: Insufficient documentation

## 2024-06-20 DIAGNOSIS — N898 Other specified noninflammatory disorders of vagina: Secondary | ICD-10-CM | POA: Insufficient documentation

## 2024-06-20 DIAGNOSIS — R81 Glycosuria: Secondary | ICD-10-CM | POA: Insufficient documentation

## 2024-06-20 HISTORY — DX: Unspecified asthma, uncomplicated: J45.909

## 2024-06-20 LAB — POCT URINE DIPSTICK
Bilirubin, UA: NEGATIVE
Glucose, UA: >=1000 mg/dL — AB
Ketones, POC UA: NEGATIVE mg/dL
Leukocytes, UA: NEGATIVE
Nitrite, UA: NEGATIVE
POC PROTEIN,UA: >=300 — AB
Spec Grav, UA: >=1.03 — AB (ref 1.010–1.025)
Urobilinogen, UA: 0.2 U/dL
pH, UA: 5.5 (ref 5.0–8.0)

## 2024-06-20 LAB — POCT URINE PREGNANCY: Preg Test, Ur: NEGATIVE

## 2024-06-20 LAB — GLUCOSE, POCT (MANUAL RESULT ENTRY): POCT Glucose (KUC): 294 mg/dL — AB (ref 70–99)

## 2024-06-20 MED ORDER — FLUCONAZOLE 150 MG PO TABS: 150.0000 mg | ORAL_TABLET | Freq: Every day | ORAL | 0 refills | Status: AC

## 2024-06-20 NOTE — ED Notes (Signed)
 Patient refused to have her BP taken.

## 2024-06-20 NOTE — ED Provider Notes (Signed)
 Meghan Lowe    CSN: [id number] Arrival date & time: 06/20/24  1317      History   Chief Complaint Chief Complaint  Patient presents with   Urinary Frequency    HPI Meghan Lowe is a 43 y.o. female.  Patient presents with dysuria, urinary frequency, vaginal discharge, vaginal itching x 2 days.  No fever, abdominal pain, hematuria, flank pain, pelvic pain.  She reports history of recurrent yeast infection with similar symptoms but is also concern for UTI.  She has been taking Azo.  Her medical history includes gestational diabetes, hypertension, asthma.  Patient states that she does not know what her last A1c was but that she was not technically diagnosed as a diabetic as it was just below the threshold.  Her baby is 63 months old.  She denies pregnancy or breast-feeding.  The history is provided by the patient and medical records.    Past Medical History:  Diagnosis Date   Anxiety    Asthma    Gestational diabetes    Headache    mirgraines started around the age of [age] years old   Hypertension     Patient Active Problem List   Diagnosis Date Noted   Status post tubal ligation 07/18/2022   Preeclampsia, severe, third trimester 07/18/2022   Paresthesia 07/06/2022   Eye abnormalities 07/06/2022   Asthma complicating pregnancy, antepartum 07/06/2022   H/O cesarean section 06/17/2022   Supervision of other normal pregnancy, antepartum 03/26/2022   Incomplete abortion 09/29/2019   Hypertension affecting pregnancy in third trimester 04/19/2016   Pregnancy complicated by fetal cerebral ventriculomegaly 04/12/2016   Chronic hypertension in pregnancy 03/22/2016   Pregnancy complicated by fetal cerebral ventriculomegaly, single gestation 03/22/2016   Gestational diabetes 03/22/2016   Hypertension in pregnancy, antepartum 03/22/2016   Chronic hypertension affecting pregnancy 03/18/2016    Past Surgical History:  Procedure Laterality Date   CESAREAN SECTION N/A  04/22/2016   Procedure: CESAREAN SECTION;  Surgeon: Mitzie BROCKS Ward, MD;  Location: [facility] ORS;  Service: Obstetrics;  Laterality: N/A;   WISDOM TOOTH EXTRACTION     four; age 2    OB History     Gravida  3   Para  1   Term  1   Preterm      AB  1   Living  1      SAB  1   IAB      Ectopic      Multiple  0   Live Births  1            Home Medications    Prior to Admission medications   Medication Sig Start Date End Date Taking? Authorizing Provider  albuterol (VENTOLIN HFA) 108 (90 Base) MCG/ACT inhaler Inhale into the lungs every 6 (six) hours as needed for wheezing or shortness of breath.   Yes [provider]  fluconazole  (DIFLUCAN ) 150 MG tablet Take 1 tablet (150 mg total) by mouth daily. Take one tablet today.  May repeat in 3 days. 06/20/24  Yes Corlis Burnard DEL, NP  diazepam  (VALIUM ) 2 MG tablet Take 1 tablet (2 mg total) by mouth every 8 (eight) hours as needed for muscle spasms. 03/09/22   Sung, Jade J, MD  metFORMIN (GLUCOPHAGE-XR) 500 MG 24 hr tablet Take 500 mg by mouth daily.    [provider]  NIFEdipine  (PROCARDIA -XL/NIFEDICAL-XL) 30 MG 24 hr tablet Take 1 tablet (30 mg total) by mouth daily. 03/09/22 03/09/23  Sung, Jade J,  MD  nitrofurantoin , macrocrystal-monohydrate, (MACROBID ) 100 MG capsule Take 1 capsule (100 mg total) by mouth 2 (two) times daily. 04/06/22   Janit Alm Agent, MD  Prenatal Vit-Fe Fumarate-FA (MULTIVITAMIN-PRENATAL) 27-0.8 MG TABS tablet Take 1 tablet by mouth daily at 12 noon.    [provider]    Family History Family History  Problem Relation Age of Onset   Hypertension Mother    Neurologic Disorder Mother    Diabetes Father    Cirrhosis Father        liver   Heart attack Paternal Grandmother    Aneurysm Paternal Grandmother        of the stomach   Heart attack Paternal Grandfather    Diabetes Paternal Grandfather     Social History Social History   Tobacco Use   Smoking status: Never     Passive exposure: Never   Smokeless tobacco: Never  Vaping Use   Vaping status: Never Used  Substance Use Topics   Alcohol use: No   Drug use: No     Allergies   Cat dander, Fish allergy, and Sulfa antibiotics   Review of Systems Review of Systems  Constitutional:  Negative for chills and fever.  Gastrointestinal:  Negative for abdominal pain, constipation, diarrhea, nausea and vomiting.  Genitourinary:  Positive for dysuria, frequency and vaginal discharge. Negative for flank pain, hematuria and pelvic pain.     Physical Exam Triage Vital Signs ED Triage Vitals [06/20/24 1348]  Encounter Vitals Group     BP      Girls Systolic BP Percentile      Girls Diastolic BP Percentile      Boys Systolic BP Percentile      Boys Diastolic BP Percentile      Pulse Rate (!) 110     Resp 18     Temp 98.5 F (36.9 C)     Temp src      SpO2 97 %     Weight      Height      Head Circumference      Peak Flow      Pain Score      Pain Loc      Pain Education      Exclude from Growth Chart    No data found.  Updated Vital Signs Pulse (!) 110   Temp 98.5 F (36.9 C)   Resp 18   LMP 05/15/2024   SpO2 97%   Breastfeeding No   Visual Acuity Right Eye Distance:   Left Eye Distance:   Bilateral Distance:    Right Eye Near:   Left Eye Near:    Bilateral Near:     Physical Exam Constitutional:      General: She is not in acute distress. HENT:     Mouth/Throat:     Mouth: Mucous membranes are moist.  Cardiovascular:     Rate and Rhythm: Normal rate.  Pulmonary:     Effort: Pulmonary effort is normal. No respiratory distress.  Abdominal:     General: Bowel sounds are normal.     Palpations: Abdomen is soft.     Tenderness: There is no abdominal tenderness. There is no right CVA tenderness, left CVA tenderness, guarding or rebound.  Neurological:     Mental Status: She is alert.      UC Treatments / Results  Labs (all labs ordered are listed, but only abnormal  results are displayed) Labs Reviewed  POCT URINE DIPSTICK - Abnormal;  Notable for the following components:      Result Value   Glucose, UA >=1,000 (*)    Spec Grav, UA >=1.030 (*)    Blood, UA small (*)    POC PROTEIN,UA >=300 (*)    All other components within normal limits  GLUCOSE, POCT (MANUAL RESULT ENTRY) - Abnormal; Notable for the following components:   POCT Glucose ([facility]) 294 (*)    All other components within normal limits  POCT URINE PREGNANCY  CERVICOVAGINAL ANCILLARY ONLY    EKG   Radiology No results found.  Procedures Procedures (including critical care time)  Medications Ordered in UC Medications - No data to display  Initial Impression / Assessment and Plan / UC Course  I have reviewed the triage vital signs and the nursing notes.  Pertinent labs & imaging results that were available during my care of the patient were reviewed by me and considered in my medical decision making (see chart for details).    Glucosuria, hyperglycemia, history of gestational diabetes, vaginal discharge.  Patient declined to have her blood pressure taken today as she reports whitecoat syndrome and feels it will be elevated.  Discussed with patient that her urine does not show signs of infection today but is positive for >1000 glucose as well as protein and blood.  Her blood sugar is 294.  Education provided on hyperglycemia.  Instructed her to follow-up with her PCP tomorrow.  ED precautions given.  Treating her vaginal discharge with Diflucan.  Tests pending for BV and yeast.  Patient agrees to plan of care.  Final Clinical Impressions(s) / UC Diagnoses   Final diagnoses:  Glucosuria  Hyperglycemia  History of gestational diabetes  Vaginal discharge     Discharge Instructions      Your urine does not show signs of infection today but it is positive for large amount of glucose, as well as protein and small blood.  Your blood sugar level today is 294.  Please follow-up  with your primary care provider tomorrow.  Go to the emergency department if you have worsening symptoms.    Your vaginal test are pending.  Take the Diflucan as directed.     ED Prescriptions     Medication Sig Dispense Auth. Provider   fluconazole (DIFLUCAN) 150 MG tablet Take 1 tablet (150 mg total) by mouth daily. Take one tablet today.  May repeat in 3 days. 2 tablet Corlis Burnard DEL, NP      PDMP not reviewed this encounter.   Corlis Burnard DEL, NP 06/20/24 1435

## 2024-06-20 NOTE — Discharge Instructions (Addendum)
 Your urine does not show signs of infection today but it is positive for large amount of glucose, as well as protein and small blood.  Your blood sugar level today is 294.  Please follow-up with your primary care provider tomorrow.  Go to the emergency department if you have worsening symptoms.    Your vaginal test are pending.  Take the Diflucan  as directed.

## 2024-06-20 NOTE — ED Triage Notes (Signed)
 Patient states she has been urinary pain, vaginal pain, vaginal itching, vaginal discharge, urinary frequency. She has tried Azo which helps some.

## 2024-06-21 LAB — CERVICOVAGINAL ANCILLARY ONLY
Bacterial Vaginitis (gardnerella): NEGATIVE
Candida Glabrata: NEGATIVE
Candida Vaginitis: NEGATIVE
Comment: NEGATIVE
Comment: NEGATIVE
Comment: NEGATIVE
